# Patient Record
Sex: Female | Born: 1960 | Race: Black or African American | Hispanic: No | Marital: Married | Smoking: Never smoker
Health system: Southern US, Community
[De-identification: ages and names within clinical notes are randomized; demographics above are authoritative.]

## PROBLEM LIST (undated history)

## (undated) DIAGNOSIS — R42 Dizziness and giddiness: Secondary | ICD-10-CM

## (undated) DIAGNOSIS — G478 Other sleep disorders: Secondary | ICD-10-CM

## (undated) DIAGNOSIS — N6452 Nipple discharge: Secondary | ICD-10-CM

## (undated) DIAGNOSIS — E7800 Pure hypercholesterolemia, unspecified: Secondary | ICD-10-CM

## (undated) DIAGNOSIS — R112 Nausea with vomiting, unspecified: Secondary | ICD-10-CM

## (undated) DIAGNOSIS — Z9889 Other specified postprocedural states: Secondary | ICD-10-CM

## (undated) DIAGNOSIS — K589 Irritable bowel syndrome without diarrhea: Secondary | ICD-10-CM

## (undated) DIAGNOSIS — K219 Gastro-esophageal reflux disease without esophagitis: Secondary | ICD-10-CM

## (undated) HISTORY — PX: TUBAL LIGATION: SHX77

## (undated) HISTORY — PX: CHOLECYSTECTOMY: SHX55

## (undated) HISTORY — PX: ESOPHAGOGASTRODUODENOSCOPY (EGD) WITH ESOPHAGEAL DILATION: SHX5812

## (undated) HISTORY — PX: BREAST EXCISIONAL BIOPSY: SUR124

## (undated) HISTORY — PX: ABDOMINAL HYSTERECTOMY: SHX81

---

## 2001-09-13 ENCOUNTER — Ambulatory Visit (HOSPITAL_COMMUNITY): Admission: RE | Admit: 2001-09-13 | Discharge: 2001-09-13 | Payer: Self-pay | Admitting: Internal Medicine

## 2001-09-16 ENCOUNTER — Encounter: Payer: Self-pay | Admitting: Internal Medicine

## 2001-09-16 ENCOUNTER — Ambulatory Visit (HOSPITAL_COMMUNITY): Admission: RE | Admit: 2001-09-16 | Discharge: 2001-09-16 | Payer: Self-pay | Admitting: Internal Medicine

## 2002-03-06 ENCOUNTER — Ambulatory Visit (HOSPITAL_COMMUNITY): Admission: RE | Admit: 2002-03-06 | Discharge: 2002-03-06 | Payer: Self-pay | Admitting: Internal Medicine

## 2002-03-06 ENCOUNTER — Encounter: Payer: Self-pay | Admitting: Internal Medicine

## 2002-04-26 ENCOUNTER — Encounter: Payer: Self-pay | Admitting: Specialist

## 2002-04-26 ENCOUNTER — Ambulatory Visit (HOSPITAL_COMMUNITY): Admission: RE | Admit: 2002-04-26 | Discharge: 2002-04-26 | Payer: Self-pay | Admitting: Specialist

## 2002-06-29 HISTORY — PX: CARPAL TUNNEL RELEASE: SHX101

## 2002-07-18 ENCOUNTER — Ambulatory Visit (HOSPITAL_COMMUNITY): Admission: RE | Admit: 2002-07-18 | Discharge: 2002-07-18 | Payer: Self-pay | Admitting: Internal Medicine

## 2003-04-04 ENCOUNTER — Ambulatory Visit (HOSPITAL_COMMUNITY): Admission: RE | Admit: 2003-04-04 | Discharge: 2003-04-04 | Payer: Self-pay | Admitting: Internal Medicine

## 2003-04-11 ENCOUNTER — Encounter: Payer: Self-pay | Admitting: Internal Medicine

## 2003-04-11 ENCOUNTER — Ambulatory Visit (HOSPITAL_COMMUNITY): Admission: RE | Admit: 2003-04-11 | Discharge: 2003-04-11 | Payer: Self-pay | Admitting: Internal Medicine

## 2003-05-10 ENCOUNTER — Ambulatory Visit (HOSPITAL_COMMUNITY): Admission: RE | Admit: 2003-05-10 | Discharge: 2003-05-10 | Payer: Self-pay | Admitting: Specialist

## 2003-06-05 ENCOUNTER — Ambulatory Visit (HOSPITAL_COMMUNITY): Admission: RE | Admit: 2003-06-05 | Discharge: 2003-06-05 | Payer: Self-pay | Admitting: Internal Medicine

## 2004-05-12 ENCOUNTER — Ambulatory Visit (HOSPITAL_COMMUNITY): Admission: RE | Admit: 2004-05-12 | Discharge: 2004-05-12 | Payer: Self-pay | Admitting: Specialist

## 2004-05-19 ENCOUNTER — Ambulatory Visit: Payer: Self-pay | Admitting: Internal Medicine

## 2004-05-30 ENCOUNTER — Ambulatory Visit: Payer: Self-pay | Admitting: Internal Medicine

## 2004-07-02 ENCOUNTER — Ambulatory Visit: Payer: Self-pay | Admitting: Internal Medicine

## 2004-08-14 ENCOUNTER — Ambulatory Visit (HOSPITAL_COMMUNITY): Admission: RE | Admit: 2004-08-14 | Discharge: 2004-08-14 | Payer: Self-pay | Admitting: Family Medicine

## 2005-01-01 ENCOUNTER — Ambulatory Visit: Payer: Self-pay | Admitting: Internal Medicine

## 2005-04-28 ENCOUNTER — Ambulatory Visit: Payer: Self-pay | Admitting: Gastroenterology

## 2005-05-01 ENCOUNTER — Ambulatory Visit: Payer: Self-pay | Admitting: Gastroenterology

## 2005-05-18 ENCOUNTER — Ambulatory Visit (HOSPITAL_COMMUNITY): Admission: RE | Admit: 2005-05-18 | Discharge: 2005-05-18 | Payer: Self-pay | Admitting: Specialist

## 2006-05-26 ENCOUNTER — Ambulatory Visit (HOSPITAL_COMMUNITY): Admission: RE | Admit: 2006-05-26 | Discharge: 2006-05-26 | Payer: Self-pay | Admitting: Obstetrics and Gynecology

## 2006-09-11 ENCOUNTER — Emergency Department (HOSPITAL_COMMUNITY): Admission: EM | Admit: 2006-09-11 | Discharge: 2006-09-11 | Payer: Self-pay | Admitting: Emergency Medicine

## 2007-05-31 ENCOUNTER — Ambulatory Visit (HOSPITAL_COMMUNITY): Admission: RE | Admit: 2007-05-31 | Discharge: 2007-05-31 | Payer: Self-pay | Admitting: Obstetrics and Gynecology

## 2008-06-15 ENCOUNTER — Ambulatory Visit (HOSPITAL_COMMUNITY): Admission: RE | Admit: 2008-06-15 | Discharge: 2008-06-15 | Payer: Self-pay | Admitting: Obstetrics and Gynecology

## 2008-10-17 ENCOUNTER — Ambulatory Visit: Payer: Self-pay | Admitting: Orthopedic Surgery

## 2008-10-17 DIAGNOSIS — M2240 Chondromalacia patellae, unspecified knee: Secondary | ICD-10-CM

## 2008-10-17 DIAGNOSIS — M25569 Pain in unspecified knee: Secondary | ICD-10-CM | POA: Insufficient documentation

## 2008-10-17 DIAGNOSIS — IMO0002 Reserved for concepts with insufficient information to code with codable children: Secondary | ICD-10-CM

## 2008-10-18 ENCOUNTER — Encounter: Payer: Self-pay | Admitting: Orthopedic Surgery

## 2009-05-28 ENCOUNTER — Encounter: Payer: Self-pay | Admitting: Obstetrics and Gynecology

## 2009-05-28 ENCOUNTER — Encounter: Admission: RE | Admit: 2009-05-28 | Discharge: 2009-05-28 | Payer: Self-pay | Admitting: Obstetrics and Gynecology

## 2009-10-29 ENCOUNTER — Encounter: Payer: Self-pay | Admitting: Orthopedic Surgery

## 2009-11-05 ENCOUNTER — Ambulatory Visit: Payer: Self-pay | Admitting: Orthopedic Surgery

## 2009-11-05 DIAGNOSIS — S92919A Unspecified fracture of unspecified toe(s), initial encounter for closed fracture: Secondary | ICD-10-CM | POA: Insufficient documentation

## 2009-12-02 ENCOUNTER — Telehealth: Payer: Self-pay | Admitting: Orthopedic Surgery

## 2010-04-07 ENCOUNTER — Ambulatory Visit (HOSPITAL_COMMUNITY): Admission: RE | Admit: 2010-04-07 | Discharge: 2010-04-07 | Payer: Self-pay | Admitting: Family Medicine

## 2010-05-29 ENCOUNTER — Telehealth: Payer: Self-pay | Admitting: Orthopedic Surgery

## 2010-05-30 ENCOUNTER — Encounter: Payer: Self-pay | Admitting: Orthopedic Surgery

## 2010-06-17 ENCOUNTER — Ambulatory Visit: Payer: Self-pay | Admitting: Orthopedic Surgery

## 2010-07-20 ENCOUNTER — Encounter: Payer: Self-pay | Admitting: Obstetrics and Gynecology

## 2010-07-29 NOTE — Progress Notes (Signed)
Summary: toe swelling   Phone Note Call from Patient   Caller: Patient Summary of Call: Patient notes swelling around toes after taking tape off; states can only wear open toe or flip flop, no regular shoe yet.  States has been icing. Fol/up was as needed. Any further recommendations or schedule to come back in? Initial call taken by: Cammie Sickle,  December 02, 2009 3:24 PM  Follow-up for Phone Call        this is normal fu if increased pain redness, continue ice and elevation Follow-up by: Ether Griffins,  December 02, 2009 3:48 PM  Additional Follow-up for Phone Call Additional follow up Details #1::        Advised patient. Additional Follow-up by: Cammie Sickle,  December 02, 2009 5:55 PM

## 2010-07-29 NOTE — Progress Notes (Signed)
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  Also said knee hurts a lot when walking outside. Please advise. Patient has also scheduled appt for office 06/17/10 + on wait list. Initial call taken by: Cammie Sickle,  May 29, 2010 4:21 PM  Follow-up for Phone Call        send to [facility] with script for open ROM hinge knee brace  Follow-up by: Fuller Canada MD,  May 30, 2010 7:39 AM  Additional Follow-up for Phone Call Additional follow up Details #1::        ok, pt to come in and pic up rx for brace befoer 12 today Additional Follow-up by: Ether Griffins,  May 30, 2010 8:46 AM

## 2010-07-29 NOTE — Letter (Signed)
Summary: History form  History form   Imported By: Jacklynn Ganong 11/07/2009 16:08:53  _____________________________________________________________________  External Attachment:    Type:   Image     Comment:   External Document

## 2010-07-29 NOTE — Assessment & Plan Note (Signed)
Summary: LT FOOT INJURY/?FX TOE/NEEDS XRAY/PCHA,MEDCOST/CAF   Vital Signs:  Patient profile:   50 year old female Height:      65 inches Weight:      263 pounds Pulse rate:   78 / minute Resp:     16 per minute  Vitals Entered By: Fuller Canada MD (Nov 05, 2009 2:15 PM)  Visit Type:  new problem Referring Provider:  self Primary Provider:  Dr. Nobie Putnam  CC:  left pinky toe pain.  History of Present Illness: I saw Christie Manning in the office today for an initial visit.  She is a 50 years old woman with the complaint of:  left pinky toe pain.  DOI 10/27/09.  Xrays today in our office.  Meds: Estroven, Zocor, Zegrid, Advil.  She complains of mild-to-moderate pain over the small toe of the LEFT foot with some swelling and pain radiating into the midfoot.    Allergies: 1)  ! Aspirin  Past History:  Past Medical History: reflux cholesterol htn  Past Surgical History: carpal tunnel releases bilateral hands rt foot hysterectomy gallbladder  Social History: Patient is married.  full time student no smoking no alcohol use 2 cups per day of caffeine  Review of Systems Constitutional:  Complains of fatigue; denies weight loss, weight gain, fever, and chills. Cardiovascular:  Complains of palpitations; denies chest pain, fainting, and murmurs. Respiratory:  Denies short of breath, wheezing, couch, tightness, pain on inspiration, and snoring . Gastrointestinal:  Complains of nausea and constipation; denies heartburn, vomiting, diarrhea, and blood in your stools. Genitourinary:  Denies frequency, urgency, difficulty urinating, painful urination, flank pain, and bleeding in urine. Neurologic:  Denies numbness, tingling, unsteady gait, dizziness, tremors, and seizure. Musculoskeletal:  Complains of joint pain; denies swelling, instability, stiffness, redness, heat, and muscle pain. Endocrine:  Denies excessive thirst, exessive urination, and heat or cold  intolerance. Psychiatric:  Denies nervousness, depression, anxiety, and hallucinations. Skin:  Complains of itching; denies changes in the skin, poor healing, rash, and redness. HEENT:  Denies blurred or double vision, eye pain, redness, and watering. Immunology:  Complains of seasonal allergies; denies sinus problems and allergic to bee stings. Hemoatologic:  Denies easy bleeding and brusing.  Physical Exam  Additional Exam:  Normal Appearance, Oriented x 3, Mood normal LEFT foot no swelling or tenderness in the proximal phalanx.  Range of motion is somewhat discomforting.  Toe is stable.  Muscle tone normal.  Skin intact.     Impression & Recommendations:  Problem # 1:  CLOSED FRACTURE OF ONE OR MORE PHALANGES OF FOOT (ICD-826.0) Assessment New  3 views LEFT foot there is a fracture oblique proximal phalanx LEFT small toe no displacement or angulation  Orders: New Patient Level II (70350) Toe Fx (09381) Toe(s) x-ray, minimum 2 views (82993)  Patient Instructions: 1)  Tape together for 3 weeks  2)  Please schedule a follow-up appointment as needed.

## 2010-07-29 NOTE — Medication Information (Signed)
Summary: Tax adviser   Imported By: Cammie Sickle 05/30/2010 10:55:50  _____________________________________________________________________  External Attachment:    Type:   Image     Comment:   External Document

## 2010-09-26 ENCOUNTER — Ambulatory Visit (HOSPITAL_COMMUNITY)
Admission: RE | Admit: 2010-09-26 | Discharge: 2010-09-26 | Disposition: A | Discharge status: Home / Self Care | Payer: PRIVATE HEALTH INSURANCE | Source: Ambulatory Visit | Attending: Family Medicine | Admitting: Family Medicine

## 2010-09-26 ENCOUNTER — Encounter (HOSPITAL_COMMUNITY): Payer: Self-pay

## 2010-09-26 ENCOUNTER — Other Ambulatory Visit (HOSPITAL_COMMUNITY): Payer: Self-pay | Admitting: Family Medicine

## 2010-09-26 DIAGNOSIS — R0789 Other chest pain: Secondary | ICD-10-CM | POA: Insufficient documentation

## 2010-11-14 NOTE — Op Note (Signed)
NAME:  Christie Manning, Christie Manning                    ACCOUNT NO.:  [id number]   MEDICAL RECORD NO.:  [id number]                   PATIENT TYPE:  AMB   LOCATION:  DAY                                  FACILITY:  [facility]   PHYSICIAN:  R. Roetta Sessions, M.D.              DATE OF BIRTH:  03/09/1961   DATE OF PROCEDURE:  07/19/2002  DATE OF DISCHARGE:  07/18/2002                                 OPERATIVE REPORT   PROCEDURE:  Ambulatory esophageal pH report.   REFERRING PHYSICIAN:  Dr. Rudell Cobb with [facility].   INDICATIONS:  Epigastric pain, gastroesophageal reflux disease, and nausea  with no response to PPI therapy.   PRIOR STUDIES:  Please see esophageal manometry study.   METHOD:  The pH electrodes were placed 20 and 5 cm above the proximal border  of the manometrically determined lower esophageal sphincter (LES). These  electrodes were defined as channels 1 and 2, respectively. The data was  converted using the [employer], version 2.30. A reflux  episode was defined as a drop in pH below 4.0. The procedure was reviewed  with the patient prior to performing it.   FINDINGS:  Proximal border of the LES (location from nares):  40 cm  Study duration:  Approximately 14 hours.  The patient pulled pH probe out on  her own around 10:40 p.m.  Channel 2 analysis:  Total time pH below 4.0 (min):  56  Fraction of total time pH below 4.0:  3.9%  Number of reflux episodes:  38  Number of reflux episodes longer than 5 minutes: 2  Longest reflux episode:  10 minutes.  DeMeester score (DeMeester normals <14.7 95th percentile):  Was not  calculated due to incomplete study.   IMPRESSION:  This pH probe study was performed off  of proton pump inhibitor  therapy.  The patient discontinued medication 2 weeks prior to study. This  study was incomplete, we have readings for only 14 hours.  Given this;  however, she did have evidence of acid reflux, the longest  episode recorded  was 10 minutes and only 2 episodes longer than 5 minutes.  It is notable,  however, that the patient's symptoms correlated with episodes of pH  __________  4 with only 1 of the 4 (25%) of symptoms recorded.  Considering  this study is incomplete, it is difficult to determine the degree of  patient's acid reflux.   PLAN:  1. She has a follow up appointment with Dr. Rudell Cobb at [facility] to discuss these results.  The pH study was requested by     Dr. Rudell Cobb.  2. Unfortunately she has been unable to tolerate all 5 proton pump     inhibitors due to either side effects or no     response with therapy.  For now she will use OTC antacids, i.e. TUMS     p.r.n.  3.  Recommended repeat pH probe study in order to get complete analysis;     however, the patient declines.   This study was reviewed by Dr. Jena Gauss.     Tana Coast, Pricilla Larsson, M.D.    LL/MEDQ  D:  07/25/2002  T:  07/25/2002  Job:  [id number]   cc:   Dr. Rudell Cobb  [facility]

## 2010-11-14 NOTE — Op Note (Signed)
NAME:  Christie Manning, Christie Manning                    ACCOUNT NO.:  [id number]   MEDICAL RECORD NO.:  [id number]                   PATIENT TYPE:  AMB   LOCATION:  DAY                                  FACILITY:  [facility]   PHYSICIAN:  R. Roetta Sessions, M.D.              DATE OF BIRTH:  1961-02-04   DATE OF PROCEDURE:  04/04/2003  DATE OF DISCHARGE:                                 OPERATIVE REPORT   PROCEDURE:  Esophagogastroduodenoscopy with Elease Hashimoto dilation followed by  biopsy.   ENDOSCOPIST:  Gerrit Friends. Rourk, M.D.   INDICATIONS FOR PROCEDURE:  The patient is a 50 year old lady with recurrent  esophageal dysphagia, history of Schatzki ring having undergone dilatation  previously.  EGD is now being done to further evaluate her symptoms.  This  approach has been discussed with the patient. The potential risks, benefits,  and alternatives have been reviewed; questions answered.  Please see my  March 29, 2003 H&P for more information.   PROCEDURE NOTE:  O2 saturation, blood pressure, pulse and respirations were  monitored throughout the entire procedure.  Conscious sedation: Versed 4 mg  IV, Demerol 50 mg IV in divided doses, Cetacaine spray for topical  oropharyngeal anesthesia.   INSTRUMENT:  Olympus video chip adult gastroscope.   FINDINGS:  Examination of the tubular esophagus revealed an indistinct  Schatzki ring.  The esophageal mucosa otherwise appeared normal.  The EG  junction was easily traversed.   STOMACH:  The gastric cavity was empty.  It insufflated well with air.  A  thorough examination of the gastric mucosa including a retroflex view of the  proximal stomach and esophagogastric junction demonstrated a small hiatal  hernia and multiple 3-to-5-mm fundal polyps.  The pylorus was patent and  easily traversed.   DUODENUM:  The bulb and the second portion appeared normal.   THERAPEUTIC/DIAGNOSTIC MANEUVERS:  A 56 French Maloney dilator was passed  with ease. A look back  revealed no apparent complication related to the  passage of the dilator.  Subsequently 2 of the polyps were cold biopsied.   The patient tolerated the procedure well and was reacted in endoscopy.   IMPRESSION:  1. Indistinct noncritical appearing Schatzki ring status post dilatation as     described above; otherwise normal esophagus.  2. Small hiatal hernia.  3. Multiple fundal polyps, of doubtful clinical significance, biopsied.  4. Otherwise normal gastric mucosa, normal D1 and D2.    RECOMMENDATIONS:  1. Continue Nexium 40 mg orally b.i.d.  2. Follow up on path.  3. Follow up appointment with [place] in 3 months.      ___________________________________________                                            Jonathon Bellows, M.D.   RMR/MEDQ  D:  04/04/2003  T:  04/04/2003  Job:  [id number]

## 2010-11-14 NOTE — Op Note (Signed)
NAME:  Christie Manning, Christie Manning                    ACCOUNT NO.:  [id number]   MEDICAL RECORD NO.:  [id number]                   PATIENT TYPE:  AMB   LOCATION:  DAY                                  FACILITY:  [facility]   PHYSICIAN:  R. Roetta Sessions, M.D.              DATE OF BIRTH:  Oct 04, 1960   DATE OF PROCEDURE:  07/18/2002  DATE OF DISCHARGE:  07/18/2002                                 OPERATIVE REPORT   PROCEDURE:  Esophageal manometry.   REFERRING PHYSICIAN:  Dr. Rudell Cobb with [facility].   INDICATIONS:  Prior to ambulatory 24-hour pH probe study, epigastric pain,  gastroesophageal reflux disease, and nausea.   PRIOR STUDIES:  EGD in March 2003 by Dr. Jena Gauss revealed indistinct Schatzki  ring, status post dilatation, small hiatal hernia, benign fundal polyps.  Barium pill esophagram shortly afterwards showed small hiatal hernia, mild  transverse narrowing of the cervical esophagus, 12.5-mm tablet passed  without evidence of obstruction.  Upper GI series with small-bowel follow  through revealed 5-to-6-cm sliding hiatal hernia, but otherwise normal.   METHOD:  The esophageal manometry study was performed using [employer]  Medical Gastrosoft Upper Gastrointestinal Polygram, version 6.40, using an  Arndorfer infusion system at 0.5 cc/min. The lower esophageal sphincter  (LES) was identified in four ports with the standard station pull through  technique. For esophageal body pressures, the tip of the catheter was placed  3 cm above the proximal aspect of the LES. Ten wet swallows were taken with  ports 5 cm apart in the esophageal body. This procedure was reviewed with  the patient prior to performing it.   FINDINGS:  Esophageal body amplitude (mmHg):  87.4 at 13 cm (normal 38-102),  29.9 at 8 cm (normal 49-131), 227 at 3 cm (normal 64-154), 43.5 mean (distal  two ports) (normal 59-139).   Duration (seconds):  4.4 at 13 cm (normal 3-5), 4.6 at 8 cm (normal 3-5),  5.2 at 3 cm (normal 2.5-3.5), 4.9 mean (distal two ports) (normal 3.5).   Contractions (with wet swallows):  40% peristaltic, 3% simultaneous and 3%  low amplitude.   Lower esophageal sphincter (LES):   Located at 44 cm (from nares).  Resting pressure:  14.5 mmHg.  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  She also did have some swallows with simultaneous contractions.  There was no evidence of achalasia, however.  Esophageal manometry study  done primarily for placement of pH probe.   RECOMMENDATION:  Please see 24-hour pH probe study report.  Esophageal  manometry was reviewed by Dr. Jena Gauss as well.     Tana Coast, P.AJonathon Bellows, M.D.    LL/MEDQ  D:  07/25/2002  T:  07/25/2002  Job:  [id number]   cc:   Dr. Rudell Cobb  [facility]

## 2010-11-14 NOTE — Op Note (Signed)
[facility]  Patient:    Christie Manning, Christie Manning Visit Number: [id number] MRN: [id number]          Service Type: END Location: DAY Attending Physician:  Jonathon Bellows Dictated by:   Roetta Sessions, M.D. Proc. Date: 09/13/01 Admit Date:  09/13/2001   CC:         Patrica Duel, M.D.   Operative Report  PROCEDURE:  Esophagogastroduodenoscopy with Elease Hashimoto dilation followed by biopsy.  INDICATIONS FOR PROCEDURE:  The patient is a 50 year old lady with recurrent esophageal dysphagia.  An EGD in 1996 revealed no abnormalities.  She responded nicely to passage of a 54-French Maloney dilator.  Reflux symptoms are well controlled on Prevacid 30 mg orally daily.  She did have some gastric polyps in 1996.  CLO-testing and H pylori serologies were negative.  EGD is now being done to further evaluate her symptoms.  This approach has been discussed with the patient previously.  Potential risks, benefits and alternatives have been reviewed and questions answered and she is agreeable. Please see my September 12, 2001 consultation note.  DESCRIPTION OF PROCEDURE:  Oxygen saturation, blood pressure, pulse, respirations were monitored throughout the entire procedure.  CONSCIOUS SEDATION:  Conscious sedation:  Versed 4 mg in divided doses, Demerol 50 mg IV.  Cetacaine spray was used for topical oropharyngeal anesthesia.  INSTRUMENT:  Olympus diagnostic gastroscope.  FINDINGS:  Examination of tubular esophagus revealed an indistinct Schatzkis ring.  No other abnormalities were observed.  The EG junction was easily traversed.  STOMACH:  The gastric cavity insufflated well with air.  A thorough examination of the gastric mucosa including retroflexed view of the proximal stomach, esophagogastric junction demonstrated a small hiatal hernia and multiple 5 mm fundal polyps.  Pylorus was patent and easily traversed.  DUODENUM:  Bulb and second portion appeared  normal.  THERAPEUTIC/DIAGNOSTIC MANEUVERS PERFORMED:  A 54-French Maloney dilator was passed to full insertion with ease.  A look back revealed a slight amount of blood at the EG junction.  The ring appeared to have been ruptured. Subsequently one of the polyps was biopsied for histologic study.  The patient tolerated the procedure well and was reacted to endoscopy.  IMPRESSION: 1. Indistinct Schatzkis ring, status post dilation as described above. 2. Small hiatal hernia. 3. Fundal polyps. 4. The remainder of the gastric mucosa and duodenum and second portion    appeared normal.  RECOMMENDATIONS: 1. Resume Prevacid 30 mg orally daily. 2. Will double check H pylori status with repeat H pylori serologies. 3. Follow up on pathology. 4. Further recommendations to follow. Dictated by:   Roetta Sessions, M.D. Attending Physician:  Jonathon Bellows DD:  09/13/01 TD:  09/14/01 Job: [id number]

## 2010-11-14 NOTE — Consult Note (Signed)
[facility]  Patient:    Christie Manning, Christie Manning Visit Number: [id number] MRN: [id number]          Service Type: OUT Location: RAD Attending Physician:  Jonathon Bellows Dictated by:   Roetta Sessions, M.D. Proc. Date: 09/12/01 Admit Date:  09/16/2001 Discharge Date: 09/16/2001   CC:         Patrica Duel, M.D.   Consultation Report  REASON FOR CONSULTATION:  Recurrent esophageal dysphagia.  HISTORY OF PRESENT ILLNESS:  The patient is a pleasant 50 year old African-American female referred courtesy of Dr. Nobie Putnam to further evaluate her recurrent esophageal dysphagia of solids and some liquids.  I last saw this nice lady back in February 1997.  In 1996, she underwent an EGD for esophageal dysphagia.  She had a normal tubular esophagus.  Mucosa appeared normal.  A 54 French balloon dilator was passed empirically.  This was associated with essentially resolution of her dysphagia until the past six months or so.  She did have a polypoid lesion of the stomach.  Biopsies revealed benign gastric mucosa.  CLOtest and H. pylori serologies were negative.  She responded nicely to a course of Prevacid and Propulsid for probable nonulcerative dyspepsia.  Back in 1997, her amylase, lipase, CBC, and liver profile were all entirely normal.  She was treated for E. coli and Proteus UTI by me with Cipro back at that time.  She now describes reflux-type symptoms which are better on Prevacid 30 mg orally daily which was started back on July 22, 2001.  She was having some hoarseness in the past couple of weeks for which she was started on Carafate.  Bowels have been doing okay although she has had some constipation with Carafate.  She has not had any diarrhea, melena, or rectal bleeding.  She does not have any anterior pulmonary symptoms.  It is notable that from July 08, 1995, her weight was 192.  Now, she weighs 228 pounds.  She is status post  laparoscopic cholecystectomy by Dr. Kate Sable in either 1999 or 2000, status post bilateral carpal tunnel surgery by Dr. Darreld Mclean last year.  There is no family history for inflammatory bowel disease or GI neoplasia.  PAST MEDICAL HISTORY:  Significant for esophageal dysphagia, history of GERD, history of nonulcerative dyspepsia.  PAST SURGICAL HISTORY:  Bilateral carpal tunnel syndrome, tubal ligation, cholecystectomy.  CURRENT MEDICATIONS:  Prevacid 30 mg orally daily.  ALLERGIES:  ASA causes nausea and vomiting.  FAMILY HISTORY:  Negative for chronic GI or liver disease.  SOCIAL HISTORY:  Patient has two children.  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  She is also in classes at [employer] in pre-nursing.  REVIEW OF SYSTEMS:  No palpitations, no dyspnea on exertion, no fever, chills. Weight gain as outlined above.  PHYSICAL EXAMINATION:  GENERAL:  A pleasant 50 year old lady resting comfortably.  VITAL SIGNS:  Weight 228, blood pressure 110/74, pulses 66.  Skin warm and dry.  HEENT:  No scleral icterus.  Conjunctivae are pink.  JVD is not prominent. CHEST:  Lungs are clear to auscultation.  CARDIAC:  Regular rate and rhythm without murmur, gallop, or rub.  BREASTS:  Exam deferred.  ABDOMEN:  Obese, positive bowel sounds, soft, nontender, without appreciable hepatosplenomegaly.  ASSESSMENT:  The patient is a pleasant 50 year old lady with recurrent esophageal dysphagia although no lesion was found in her esophagus.  She previously responded nicely to the passage of a Maloney dilator.  In this  setting, I have offered the patient a repeat esophagogastroduodenoscopy with [facility] dilation.  I have discussed this procedure with her along with the potential risks, benefits, and alternatives.  Her questions were answered.  I feel she is at low risk for conscious sedation.  Will plan to perform esophagogastroduodenoscopy with possible  esophageal dilation in the near future at [facility].  I would like to thank Dr. Patrica Duel for allowing me to see this nice lady once again. Dictated by:   Roetta Sessions, M.D. Attending Physician:  Jonathon Bellows DD:  09/12/01 TD:  09/12/01 Job: [id number]

## 2010-11-14 NOTE — H&P (Signed)
Christie Manning, Christie Manning                    ACCOUNT NO.:  [id number]   MEDICAL RECORD NO.:  [id number]                  PATIENT TYPE:   LOCATION:                                       FACILITY:   PHYSICIAN:  R. Roetta Sessions, M.D.              DATE OF BIRTH:  11/28/1960   DATE OF ADMISSION:  03/29/2003  DATE OF DISCHARGE:                                HISTORY & PHYSICAL   CHIEF COMPLAINT:  1. Recurrent esophageal dysphagia.  2. Gastroesophageal reflux disease symptoms.  3. Hoarseness.   HISTORY OF PRESENT ILLNESS:  Christie Manning is a 50 year old, nearly morbid  obese African American female, followed primarily by Christie Manning who  was last seen here for abdominal pain, bloating and reflux symptoms on  April 06, 2002.  Since she was last seen here, constipation has been  combated effectively with Zelnorm 60 mg orally b.i.d. to once daily.  She  has been on a variety of proton pump inhibitors therapy previously including  Nexium, Prevacid more recently.  Aciphex once daily.  She is now having  breakthrough symptoms and reports recurrent esophageal dysphagia back in  1999.  She underwent EGD and was found to have a Schatzki's ring which was  dilated with a Maloney dilator.  This was associated with fairly good long  term improvement in symptoms.  She has gained a great deal of weight since  1997 (approximately 50 pounds).  She wanted gastric bypass surgery.  She was  evaluated by Christie Manning and Christie Manning as well as Christie Manning at  [facility].  She was found to have an element of GERD and abdominal  wall pain and she as not felt to be a good candidate for antireflux surgery.  Because of her excessive weight, she was advised to loose weight.  Unfortunately, she has gained 7 more pounds, and she was seen here in  October 2003.  Has not had any melena or rectal bleeding.  Bowel function is  pretty much 1-2 daily on Zelnorm 60 mg predominantly once daily.   PAST  MEDICAL HISTORY:  1. Esophageal dysphagia.  2. GERD.  3. Schatzki's ring.  4. Nonulcerative dyspepsia.   PAST SURGICAL HISTORY:  1. Cholecystectomy.  2. Tubal ligation.  3. Bilateral carpal tunnel syndrome surgery.  4. Workup for GERD.  5. Previously EGD.  6. Esophageal manometry and amateur pH study.  The latter study incomplete.     She pulled a tube out.  Did not show overt reflux disease and was     relatively poor correlation with symptoms in episodes of reflux.   MEDICATIONS:  1. Vivelle patch 0.1 mg every two weeks.  2. Aciphex 20 mg orally once daily times p.r.n.  3. Zelnorm 60 mg once daily.  4. Clarinex 5 mg daily.   ALLERGIES:  ASPIRIN.   FAMILY HISTORY:  Negative for chronic GI or liver disease.   SOCIAL HISTORY:  The  patient has two children.  She works for [employer].  She has been taking pre-nursing classes.   REVIEW OF SYSTEMS:  As in history of present illness.   PHYSICAL EXAMINATION:  GENERAL APPEARANCE:  This was a pleasant 50 year old  lady resting comfortably.  VITAL SIGNS:  Weight 238, blood pressure 110/80, pulse 76.  HEENT:  No scleral icterus.  CHEST:  Lungs are clear to auscultation.  CARDIAC:  Regular rate and rhythm without murmurs, rubs or gallops.  ABDOMEN:  Nondistended, obese, positive bowel sounds, soft.  No obvious mass  or organomegaly.  EXTREMITIES:  No edema.   ASSESSMENT:  Christie Manning is a pleasant 50 year old morbidly obese lady with  gastroesophageal reflux disease symptoms and esophageal dysphagia.  Gastroesophageal reflux disease symptoms have been difficult to control.  Prior endoscopic evaluation and ambulatory esophageal pH monitoring have  failed to demonstrate significant gastroesophageal reflux.  I suspect we are  dealing more with NERD (nonerosive gastroesophageal reflux disease) which  almost always has a functional component and is more difficult to control  than the typical gastroesophageal reflux disease.   Nexium worked well for  her previously.   RECOMMENDATIONS:  I have offered this lady repeat EGD with esophageal  dilation, as she does have a history of a Schatzki's ring and derived  significant improvement after dilation previously.  Potential risks,  benefits and alternatives have been reviewed.  Will go ahead and get her  back on an aggressive regimen to be Nexium 40 mg orally b.i.d.  Samples  given today.  Will make further recommendations after endoscopic evaluation  has been completed.     ___________________________________________                                         Christie Manning, M.D.   RMR/MEDQ  D:  03/29/2003  T:  03/29/2003  Job:  [id number]   cc:   R. Roetta Sessions, M.D.  [id number]  [place]  [place] [address]  Fax: [phone]

## 2011-04-23 ENCOUNTER — Encounter: Payer: Self-pay | Admitting: Orthopedic Surgery

## 2011-04-23 ENCOUNTER — Ambulatory Visit (INDEPENDENT_AMBULATORY_CARE_PROVIDER_SITE_OTHER): Payer: PRIVATE HEALTH INSURANCE | Admitting: Orthopedic Surgery

## 2011-04-23 VITALS — BP 106/70 | Ht 64.0 in | Wt 257.4 lb

## 2011-04-23 DIAGNOSIS — M5430 Sciatica, unspecified side: Secondary | ICD-10-CM

## 2011-04-23 MED ORDER — GABAPENTIN 100 MG PO CAPS: 100.0000 mg | ORAL_CAPSULE | Freq: Every day | ORAL | Status: DC

## 2011-04-23 NOTE — Progress Notes (Signed)
Pain LEFT knee and LEFT leg.  50 year old female new problem. The patient was lifting her mom at the nursing home felt pain in her LEFT hip rating down her LEFT leg approximately 2 weeks ago. She's been taking ibuprofen and Robaxin. The pain seemed to be getting better until yesterday and started hurting again. She is now having stabbing 8/10. Constant pain with numbness and tingling in the LEFT foot, which is worse when she stands up and when she puts pressure or lies down on her LEFT side.  Review of systems heartburn, constipation, tingling, as stated. Joint pain and swelling. Denies genitourinary symptoms or difficulty voiding.  She gives [place] a medical history of previous RIGHT and LEFT carpal tunnel releases. No major medical problems.  She would ports allergic to aspirin.  Family history of arthritis, cancer, and diabetes.  She is married unemployed does not drink or smoke.  Physical Exam(12) GENERAL: normal development grooming and hygiene, obesity  CDV: pulses are normal   Skin: normal  Lymph: nodes were not palpable/normal  Psychiatric: awake, alert and oriented  Neuro: normal sensation  MSK gait normal  1She is tender in her lower back, LEFT gluteal area. She has no pain with flexion. She can reproduce her pain with extension.  She has normal heel walking and toe walking. Neurovascular exam is normal.   Assessment: Back pain with sciatica    Plan: Recommended Gabapentin . She's had reaction to the steroids in the past with palpitations over going to avoid that at this time. There

## 2011-04-23 NOTE — Patient Instructions (Signed)
4 weeks recheck   start gabapentin 100 mg 3 x a day (its at the pharmacy)

## 2011-05-07 ENCOUNTER — Other Ambulatory Visit (HOSPITAL_COMMUNITY): Payer: Self-pay | Admitting: Obstetrics and Gynecology

## 2011-05-07 DIAGNOSIS — Z139 Encounter for screening, unspecified: Secondary | ICD-10-CM

## 2011-05-11 ENCOUNTER — Ambulatory Visit (HOSPITAL_COMMUNITY): Payer: PRIVATE HEALTH INSURANCE

## 2011-05-18 ENCOUNTER — Ambulatory Visit (HOSPITAL_COMMUNITY)
Admission: RE | Admit: 2011-05-18 | Discharge: 2011-05-18 | Disposition: A | Discharge status: Home / Self Care | Payer: PRIVATE HEALTH INSURANCE | Source: Ambulatory Visit | Attending: Obstetrics and Gynecology | Admitting: Obstetrics and Gynecology

## 2011-05-18 DIAGNOSIS — Z1231 Encounter for screening mammogram for malignant neoplasm of breast: Secondary | ICD-10-CM | POA: Insufficient documentation

## 2011-05-18 DIAGNOSIS — Z139 Encounter for screening, unspecified: Secondary | ICD-10-CM

## 2011-05-26 ENCOUNTER — Ambulatory Visit: Payer: PRIVATE HEALTH INSURANCE | Admitting: Orthopedic Surgery

## 2011-06-11 ENCOUNTER — Ambulatory Visit: Payer: PRIVATE HEALTH INSURANCE | Admitting: Orthopedic Surgery

## 2011-07-07 ENCOUNTER — Ambulatory Visit: Payer: PRIVATE HEALTH INSURANCE | Admitting: Orthopedic Surgery

## 2011-07-09 ENCOUNTER — Encounter: Payer: Self-pay | Admitting: Orthopedic Surgery

## 2011-07-09 ENCOUNTER — Ambulatory Visit (INDEPENDENT_AMBULATORY_CARE_PROVIDER_SITE_OTHER): Payer: PRIVATE HEALTH INSURANCE | Admitting: Orthopedic Surgery

## 2011-07-09 DIAGNOSIS — M23329 Other meniscus derangements, posterior horn of medial meniscus, unspecified knee: Secondary | ICD-10-CM

## 2011-07-09 DIAGNOSIS — M238X9 Other internal derangements of unspecified knee: Secondary | ICD-10-CM

## 2011-07-09 DIAGNOSIS — M25369 Other instability, unspecified knee: Secondary | ICD-10-CM

## 2011-07-09 NOTE — Progress Notes (Signed)
Patient ID: Christie Manning, female   DOB: 1961-04-06, 51 y.o.   MRN: [id number] Chief Complaint  Patient presents with  . Follow-up    4 week recheck Lt knee  . Knee Injury    right knee      hpi This is a 51 year old female who hit her knee against a dresser twisted fell and since that time has had increasing medial knee pain swelling stiffness and giving out of the knee.  Injured in mid December and has not had any relief since that time despite over-the-counter medicine such as Tylenol and Advil  We treated her for LEFT leg sciatica which resolved  Review of systems pertinent neurologic symptoms negative cardiovascular symptoms negative  Exam moderately obese black female vital signs stable oriented x3 mood and affect normal ambulates with a limp  RIGHT knee flexion pain at 125 medial joint line tenderness, bursal tenderness, iliotibial band tenderness.  Knee is stable in terms of the ACL PCL and collaterals.  Muscle tone normal.  Skin normal.  Pulse and temperature normal.  Sensation normal.  Provocative test McMurray sign positive, hyperflexion test positive, screwed home test positive.  Impression torn medial meniscus RIGHT knee  Plan MRI RIGHT knee

## 2011-07-09 NOTE — Patient Instructions (Signed)
MRI will be arranged after insurance pre-certification  Apply ice to the knee as needed   Take over the counter tylenol or aleve for pain

## 2011-07-24 ENCOUNTER — Other Ambulatory Visit (HOSPITAL_COMMUNITY): Payer: PRIVATE HEALTH INSURANCE

## 2011-07-24 ENCOUNTER — Ambulatory Visit (HOSPITAL_COMMUNITY): Payer: PRIVATE HEALTH INSURANCE

## 2011-07-27 ENCOUNTER — Telehealth: Payer: Self-pay | Admitting: Radiology

## 2011-07-27 NOTE — Telephone Encounter (Signed)
I called to give the patient her MRI appointment at [facility] and she was going to call them back and reschedul. She has [employer] insurance, no precert needed per [facility].

## 2011-07-28 ENCOUNTER — Ambulatory Visit: Payer: PRIVATE HEALTH INSURANCE | Admitting: Orthopedic Surgery

## 2012-01-08 ENCOUNTER — Other Ambulatory Visit: Payer: Self-pay | Admitting: Obstetrics and Gynecology

## 2012-01-08 DIAGNOSIS — Z139 Encounter for screening, unspecified: Secondary | ICD-10-CM

## 2012-01-08 DIAGNOSIS — N83209 Unspecified ovarian cyst, unspecified side: Secondary | ICD-10-CM | POA: Insufficient documentation

## 2012-01-08 DIAGNOSIS — Z1231 Encounter for screening mammogram for malignant neoplasm of breast: Secondary | ICD-10-CM

## 2012-01-12 ENCOUNTER — Ambulatory Visit: Payer: PRIVATE HEALTH INSURANCE | Admitting: Obstetrics and Gynecology

## 2012-02-01 ENCOUNTER — Encounter: Payer: Self-pay | Admitting: Obstetrics and Gynecology

## 2012-02-01 ENCOUNTER — Ambulatory Visit (INDEPENDENT_AMBULATORY_CARE_PROVIDER_SITE_OTHER): Payer: PRIVATE HEALTH INSURANCE | Admitting: Obstetrics and Gynecology

## 2012-02-01 VITALS — BP 118/74 | Ht 64.6 in | Wt 264.0 lb

## 2012-02-01 DIAGNOSIS — Z01419 Encounter for gynecological examination (general) (routine) without abnormal findings: Secondary | ICD-10-CM

## 2012-02-01 DIAGNOSIS — E669 Obesity, unspecified: Secondary | ICD-10-CM

## 2012-02-01 DIAGNOSIS — Z90710 Acquired absence of both cervix and uterus: Secondary | ICD-10-CM | POA: Insufficient documentation

## 2012-02-01 NOTE — Progress Notes (Signed)
Subjective:    Christie Manning is a 51 y.o. female G2P2 who presents for annual exam. The patient is recovering from an injured knee and foot.  She is status post hysterectomy.  The following portions of the patient's history were reviewed and updated as appropriate: allergies, current medications, past family history, past medical history, past social history, past surgical history and problem list. See above.  Review of Systems Pertinent items are noted in HPI. Gastrointestinal:No change in bowel habits, no abdominal pain, no rectal bleeding Genitourinary:negative for dysuria, frequency, hematuria, nocturia and urinary incontinence    Objective:     BP 118/74  Ht 5' 4.6" (1.641 m)  Wt 264 lb (119.75 kg)  BMI 44.48 kg/m2  Weight:  Wt Readings from Last 1 Encounters:  02/01/12 264 lb (119.75 kg)     BMI: Body mass index is 44.48 kg/(m^2). General Appearance: Alert, appropriate appearance for age. No acute distress HEENT: Grossly normal Neck / Thyroid: Supple, no masses, nodes or enlargement Lungs: clear to auscultation bilaterally Back: No CVA tenderness Breast Exam: No masses or nodes.No dimpling, nipple retraction or discharge. Cardiovascular: Regular rate and rhythm. S1, S2, no murmur Gastrointestinal: Soft, non-tender, no masses or organomegaly  ++++++++++++++++++++++++++++++++++++++++++++++++++++++++  Pelvic Exam: External genitalia: normal general appearance Vaginal: atrophic mucosa Cervix: absent Adnexa: normal bimanual exam Uterus: absent Rectovaginal: normal rectal, no masses  ++++++++++++++++++++++++++++++++++++++++++++++++++++++++  Lymphatic Exam: Non-palpable nodes in neck, clavicular, axillary, or inguinal regions  Psychiatric: Alert and oriented, appropriate affect.    Urinalysis:Not done      Assessment:    Normal gyn exam   Overweight or obese: Yes  Pelvic relaxation: Yes  Menopausal symptoms: Yes. Severe: No.   Plan:    weight loss  and exercise reviewed   Follow-up:  for annual exam  STD screen request: none,  RPR: No,  HBsAg: No.  Hepatitis C: No.  The updated Pap smear screening guidelines were discussed with the patient. The patient requested that I obtain a Pap smear: No.  Kegel exercises discussed: Yes.  Proper diet and regular exercise were reviewed.  Annual mammograms recommended starting at age 33. Proper breast care was discussed.  Screening colonoscopy is recommended beginning at age 60.  Regular health maintenance was reviewed.  Sleep hygiene was discussed.  Leonard Schwartz M.D.   Screening questions:  Last Pap: 10/03/2008 WNL: Yes Regular Periods:no Contraception: HYST  Monthly Breast exam:yes Tetanus<41yrs:yes Nl.Bladder Function:yes Daily BMs:no Healthy Diet:yes Calcium:yes Mammogram:yes Date of Mammogram: 04/2011 Exercise:no Have often Exercise: walk hurt knee  Seatbelt: yes Abuse at home: no Stressful work:no Sigmoid-colonoscopy: 3 years ago  Bone Density: No PCP: Dr. Phillips Odor  Change in PMH: none Change in [facility] none

## 2012-05-23 ENCOUNTER — Ambulatory Visit (HOSPITAL_COMMUNITY): Payer: PRIVATE HEALTH INSURANCE

## 2012-06-10 ENCOUNTER — Other Ambulatory Visit: Payer: Self-pay | Admitting: Obstetrics and Gynecology

## 2012-06-10 ENCOUNTER — Ambulatory Visit: Payer: Self-pay

## 2012-06-10 ENCOUNTER — Other Ambulatory Visit (HOSPITAL_COMMUNITY): Payer: Self-pay | Admitting: Obstetrics and Gynecology

## 2012-06-10 DIAGNOSIS — Z139 Encounter for screening, unspecified: Secondary | ICD-10-CM

## 2012-06-14 ENCOUNTER — Ambulatory Visit (HOSPITAL_COMMUNITY): Payer: Self-pay

## 2012-06-17 ENCOUNTER — Ambulatory Visit (HOSPITAL_COMMUNITY)
Admission: RE | Admit: 2012-06-17 | Discharge: 2012-06-17 | Disposition: A | Discharge status: Home / Self Care | Payer: PRIVATE HEALTH INSURANCE | Source: Ambulatory Visit | Attending: Obstetrics and Gynecology | Admitting: Obstetrics and Gynecology

## 2012-06-17 DIAGNOSIS — Z1231 Encounter for screening mammogram for malignant neoplasm of breast: Secondary | ICD-10-CM | POA: Insufficient documentation

## 2012-06-17 DIAGNOSIS — Z139 Encounter for screening, unspecified: Secondary | ICD-10-CM

## 2012-06-21 ENCOUNTER — Ambulatory Visit: Payer: Self-pay

## 2012-08-24 ENCOUNTER — Ambulatory Visit: Payer: PRIVATE HEALTH INSURANCE | Admitting: Orthopedic Surgery

## 2012-08-27 DIAGNOSIS — N6452 Nipple discharge: Secondary | ICD-10-CM

## 2012-08-27 HISTORY — DX: Nipple discharge: N64.52

## 2012-09-12 ENCOUNTER — Encounter (INDEPENDENT_AMBULATORY_CARE_PROVIDER_SITE_OTHER): Payer: Self-pay | Admitting: Surgery

## 2012-09-12 ENCOUNTER — Ambulatory Visit (INDEPENDENT_AMBULATORY_CARE_PROVIDER_SITE_OTHER): Payer: PRIVATE HEALTH INSURANCE | Admitting: Surgery

## 2012-09-12 VITALS — BP 128/78 | HR 89 | Temp 97.2°F | Resp 18 | Ht 64.0 in | Wt 268.0 lb

## 2012-09-12 DIAGNOSIS — N6459 Other signs and symptoms in breast: Secondary | ICD-10-CM

## 2012-09-12 DIAGNOSIS — N6452 Nipple discharge: Secondary | ICD-10-CM

## 2012-09-12 NOTE — Progress Notes (Signed)
Patient ID: Christie Manning, female   DOB: 12/21/1960, 52 y.o.   MRN: [id number]  Chief Complaint  Patient presents with  . New Evaluation    eval Rt nipple discharge    HPI Christie Manning is a 52 y.o. female.  Patient presents with a chief complaint of right breast nipple discharge. She was seen here in the past the same condition back in 2011. Workup was negative and ductogram could not be done. The drainage has persisted has become more heavy. She has intermittent bloody drainage the right nipple mixed with clear drainage from time to time. Her last mammogram in December 2013 was BI-RADS 1. HPI  Past Medical History  Diagnosis Date  . Multiple myeloma   . Ovarian cyst     Past Surgical History  Procedure Laterality Date  . Abdominal hysterectomy    . Tubal ligation    . Carpal tunnel release    . Gallbladder surgery      Family History  Problem Relation Age of Onset  . Diabetes Mother   . Diabetes Sister   . Hypertension Sister   . Hypertension Brother   . Cancer Maternal Grandmother   . Hypertension Brother     Social History History  Substance Use Topics  . Smoking status: Never Smoker   . Smokeless tobacco: Never Used  . Alcohol Use: No    Allergies  Allergen Reactions  . Aspirin     Current Outpatient Prescriptions  Medication Sig Dispense Refill  . Ascorbic Acid (VITAMIN C PO) Take by mouth daily.      . Cyanocobalamin (VITAMIN B 12 PO) Take by mouth daily.      . fish oil-omega-3 fatty acids 1000 MG capsule Take by mouth daily.      . Nutritional Supplements (ESTROVEN PO) Take by mouth daily.      . VITAMIN D, CHOLECALCIFEROL, PO Take by mouth daily.       No current facility-administered medications for this visit.    Review of Systems Review of Systems  Constitutional: Negative for fever, chills and unexpected weight change.  HENT: Negative for hearing loss, congestion, sore throat, trouble swallowing and voice change.   Eyes:  Negative for visual disturbance.  Respiratory: Negative for cough and wheezing.   Cardiovascular: Negative for chest pain, palpitations and leg swelling.  Gastrointestinal: Negative for nausea, vomiting, abdominal pain, diarrhea, constipation, blood in stool, abdominal distention and anal bleeding.  Genitourinary: Negative for hematuria, vaginal bleeding and difficulty urinating.  Musculoskeletal: Negative for arthralgias.  Skin: Negative for rash and wound.  Neurological: Negative for seizures, syncope and headaches.  Hematological: Negative for adenopathy. Does not bruise/bleed easily.  Psychiatric/Behavioral: Negative for confusion.    Blood pressure 128/78, pulse 89, temperature 97.2 F (36.2 C), temperature source Temporal, resp. rate 18, height 5' 4" (1.626 m), weight 268 lb (121.564 kg).  Physical Exam Physical Exam  Constitutional: She is oriented to person, place, and time.  HENT:  Head: Normocephalic and atraumatic.  Eyes: EOM are normal. Pupils are equal, round, and reactive to light.  Neck: Normal range of motion. Neck supple.  Cardiovascular: Normal rate and regular rhythm.   Pulmonary/Chest: Effort normal. Right breast exhibits nipple discharge. Right breast exhibits no inverted nipple, no mass, no skin change and no tenderness. Left breast exhibits no inverted nipple, no mass, no nipple discharge, no skin change and no tenderness. Breasts are symmetrical.    Musculoskeletal: Normal range of motion.  Neurological: She is alert and   oriented to person, place, and time.  Skin: Skin is warm and dry.  Psychiatric: She has a normal mood and affect. Her behavior is normal. Judgment and thought content normal.    Data Reviewed DIGITAL BILATERAL SCREENING MAMMOGRAM WITH CAD  Comparison: 05/28/2009  FINDINGS:  ACR Breast Density Category 1: The breast tissue is almost entirely  fatty.  There is no suspicious dominant mass, architectural distortion, or  calcification to  suggest malignancy.  Images were processed with CAD.  IMPRESSION:  No mammographic evidence of malignancy.  A result letter of this screening mammogram will be mailed directly  to the patient.  RECOMMENDATION:  Screening mammogram in one year. (Code:SM-B-01Y)  BI-RADS CATEGORY 1: Negative.    Assessment    Right nipple discharge with negative work up persistent since 2011    Plan    Patient would like to have surgery to resolve this issue of persistent right breast nipple discharge. Recommend right breast central duct excision. Risk of bleeding, infection, nipple loss, pain, and the need for other operations discussed. The effectiveness of the surgery at treating his condition is 50-90%.The procedure has been discussed with the patient.  Alternative therapies have been discussed with the patient.  Operative risks include bleeding,  Infection,  Organ injury,  Nerve injury,  Blood vessel injury,  DVT,  Pulmonary embolism,  Death,  And possible reoperation.  Medical management risks include worsening of present situation.  The success of the procedure is 50 -90 % at treating patients symptoms.  The patient understands and agrees to proceed.       Janaria Mccammon A. 09/12/2012, 3:42 PM    

## 2012-09-12 NOTE — Patient Instructions (Signed)
Excision of milk duct from right breast will be scheduled. The procedure has been discussed with the patient.  Alternative therapies have been discussed with the patient.  Operative risks include bleeding,  Infection,  Organ injury,   And possible reoperation.  Medical management risks include worsening of present situation.  The success of the procedure is 50 -90 % at treating patients symptoms.

## 2012-09-22 ENCOUNTER — Encounter (HOSPITAL_BASED_OUTPATIENT_CLINIC_OR_DEPARTMENT_OTHER): Payer: Self-pay | Admitting: *Deleted

## 2012-09-22 NOTE — Pre-Procedure Instructions (Signed)
To come for CBC, diff, CMET, CXR 

## 2012-09-26 ENCOUNTER — Encounter (HOSPITAL_BASED_OUTPATIENT_CLINIC_OR_DEPARTMENT_OTHER)
Admission: RE | Admit: 2012-09-26 | Discharge: 2012-09-26 | Disposition: A | Discharge status: Home / Self Care | Payer: PRIVATE HEALTH INSURANCE | Source: Ambulatory Visit | Attending: Surgery | Admitting: Surgery

## 2012-09-26 ENCOUNTER — Ambulatory Visit
Admission: RE | Admit: 2012-09-26 | Discharge: 2012-09-26 | Disposition: A | Discharge status: Home / Self Care | Payer: PRIVATE HEALTH INSURANCE | Source: Ambulatory Visit | Attending: Surgery | Admitting: Surgery

## 2012-09-26 LAB — COMPREHENSIVE METABOLIC PANEL
CO2: 30 mEq/L (ref 19–32)
Calcium: 10.1 mg/dL (ref 8.4–10.5)
Creatinine, Ser: 0.72 mg/dL (ref 0.50–1.10)
GFR calc Af Amer: >90 mL/min (ref >90)
GFR calc non Af Amer: >90 mL/min (ref >90)
Glucose, Bld: 101 mg/dL — ABNORMAL HIGH (ref 70–99)

## 2012-09-26 LAB — CBC WITH DIFFERENTIAL/PLATELET
Eosinophils Relative: 2 % (ref 0–5)
HCT: 37.4 % (ref 36.0–46.0)
Lymphocytes Relative: 46 % (ref 12–46)
Lymphs Abs: 2.2 10*3/uL (ref 0.7–4.0)
MCV: 89.3 fL (ref 78.0–100.0)
Monocytes Absolute: 0.3 10*3/uL (ref 0.1–1.0)
RBC: 4.19 MIL/uL (ref 3.87–5.11)
RDW: 13.1 % (ref 11.5–15.5)
WBC: 4.7 10*3/uL (ref 4.0–10.5)

## 2012-09-28 ENCOUNTER — Ambulatory Visit (HOSPITAL_BASED_OUTPATIENT_CLINIC_OR_DEPARTMENT_OTHER)
Admission: RE | Admit: 2012-09-28 | Discharge: 2012-09-28 | Disposition: A | Discharge status: Home / Self Care | Payer: PRIVATE HEALTH INSURANCE | Source: Ambulatory Visit | Attending: Surgery | Admitting: Surgery

## 2012-09-28 ENCOUNTER — Ambulatory Visit (HOSPITAL_BASED_OUTPATIENT_CLINIC_OR_DEPARTMENT_OTHER): Payer: PRIVATE HEALTH INSURANCE | Admitting: Anesthesiology

## 2012-09-28 ENCOUNTER — Encounter (HOSPITAL_BASED_OUTPATIENT_CLINIC_OR_DEPARTMENT_OTHER)
Admission: RE | Disposition: A | Discharge status: Home / Self Care | Payer: Self-pay | Source: Ambulatory Visit | Attending: Surgery

## 2012-09-28 ENCOUNTER — Encounter (HOSPITAL_BASED_OUTPATIENT_CLINIC_OR_DEPARTMENT_OTHER): Payer: Self-pay | Admitting: *Deleted

## 2012-09-28 ENCOUNTER — Encounter (HOSPITAL_BASED_OUTPATIENT_CLINIC_OR_DEPARTMENT_OTHER): Payer: Self-pay | Admitting: Anesthesiology

## 2012-09-28 DIAGNOSIS — M25569 Pain in unspecified knee: Secondary | ICD-10-CM

## 2012-09-28 DIAGNOSIS — S92919A Unspecified fracture of unspecified toe(s), initial encounter for closed fracture: Secondary | ICD-10-CM

## 2012-09-28 DIAGNOSIS — C9000 Multiple myeloma not having achieved remission: Secondary | ICD-10-CM | POA: Insufficient documentation

## 2012-09-28 DIAGNOSIS — M2240 Chondromalacia patellae, unspecified knee: Secondary | ICD-10-CM

## 2012-09-28 DIAGNOSIS — D249 Benign neoplasm of unspecified breast: Secondary | ICD-10-CM

## 2012-09-28 DIAGNOSIS — Z809 Family history of malignant neoplasm, unspecified: Secondary | ICD-10-CM | POA: Insufficient documentation

## 2012-09-28 DIAGNOSIS — K219 Gastro-esophageal reflux disease without esophagitis: Secondary | ICD-10-CM | POA: Insufficient documentation

## 2012-09-28 DIAGNOSIS — Z886 Allergy status to analgesic agent status: Secondary | ICD-10-CM | POA: Insufficient documentation

## 2012-09-28 DIAGNOSIS — N6049 Mammary duct ectasia of unspecified breast: Secondary | ICD-10-CM | POA: Insufficient documentation

## 2012-09-28 DIAGNOSIS — N83209 Unspecified ovarian cyst, unspecified side: Secondary | ICD-10-CM

## 2012-09-28 DIAGNOSIS — Z90710 Acquired absence of both cervix and uterus: Secondary | ICD-10-CM

## 2012-09-28 DIAGNOSIS — E669 Obesity, unspecified: Secondary | ICD-10-CM

## 2012-09-28 DIAGNOSIS — M76899 Other specified enthesopathies of unspecified lower limb, excluding foot: Secondary | ICD-10-CM

## 2012-09-28 DIAGNOSIS — Z79899 Other long term (current) drug therapy: Secondary | ICD-10-CM | POA: Insufficient documentation

## 2012-09-28 DIAGNOSIS — N6019 Diffuse cystic mastopathy of unspecified breast: Secondary | ICD-10-CM | POA: Insufficient documentation

## 2012-09-28 HISTORY — DX: Pure hypercholesterolemia, unspecified: E78.00

## 2012-09-28 HISTORY — DX: Nausea with vomiting, unspecified: R11.2

## 2012-09-28 HISTORY — DX: Dizziness and giddiness: R42

## 2012-09-28 HISTORY — DX: Other sleep disorders: G47.8

## 2012-09-28 HISTORY — PX: BREAST DUCTAL SYSTEM EXCISION: SHX5242

## 2012-09-28 HISTORY — DX: Gastro-esophageal reflux disease without esophagitis: K21.9

## 2012-09-28 HISTORY — DX: Other specified postprocedural states: Z98.890

## 2012-09-28 HISTORY — DX: Nipple discharge: N64.52

## 2012-09-28 SURGERY — EXCISION DUCTAL SYSTEM BREAST
Anesthesia: General | Site: Breast | Laterality: Right | Wound class: Clean

## 2012-09-28 MED ORDER — HYDROMORPHONE HCL PF 1 MG/ML IJ SOLN
0.2500 mg | INTRAMUSCULAR | Status: DC | PRN
  Administered 2012-09-28 (×2): 0.5 mg via INTRAVENOUS

## 2012-09-28 MED ORDER — DEXAMETHASONE SODIUM PHOSPHATE 4 MG/ML IJ SOLN
INTRAMUSCULAR | Status: DC | PRN
  Administered 2012-09-28: 10 mg via INTRAVENOUS

## 2012-09-28 MED ORDER — FENTANYL CITRATE 0.05 MG/ML IJ SOLN: 50.0000 ug | INTRAMUSCULAR | Status: DC | PRN

## 2012-09-28 MED ORDER — ACETAMINOPHEN 10 MG/ML IV SOLN
1000.0000 mg | Freq: Once | INTRAVENOUS | Status: AC
  Administered 2012-09-28: 1000 mg via INTRAVENOUS

## 2012-09-28 MED ORDER — CEFAZOLIN SODIUM 10 G IJ SOLR
3.0000 g | INTRAMUSCULAR | Status: AC
  Administered 2012-09-28: 3 g via INTRAVENOUS

## 2012-09-28 MED ORDER — OXYCODONE HCL 5 MG PO TABS: 5.0000 mg | ORAL_TABLET | Freq: Once | ORAL | Status: DC | PRN

## 2012-09-28 MED ORDER — LACTATED RINGERS IV SOLN
INTRAVENOUS | Status: DC
  Administered 2012-09-28 (×2): via INTRAVENOUS

## 2012-09-28 MED ORDER — PROMETHAZINE HCL 25 MG/ML IJ SOLN
6.2500 mg | Freq: Once | INTRAMUSCULAR | Status: AC
  Administered 2012-09-28: 6.25 mg via INTRAVENOUS

## 2012-09-28 MED ORDER — MIDAZOLAM HCL 2 MG/ML PO SYRP: 12.0000 mg | ORAL_SOLUTION | Freq: Once | ORAL | Status: DC | PRN

## 2012-09-28 MED ORDER — FENTANYL CITRATE 0.05 MG/ML IJ SOLN
INTRAMUSCULAR | Status: DC | PRN
  Administered 2012-09-28: 100 ug via INTRAVENOUS

## 2012-09-28 MED ORDER — OXYCODONE HCL 5 MG/5ML PO SOLN: 5.0000 mg | Freq: Once | ORAL | Status: DC | PRN

## 2012-09-28 MED ORDER — PROPOFOL 10 MG/ML IV BOLUS
INTRAVENOUS | Status: DC | PRN
  Administered 2012-09-28: 200 mg via INTRAVENOUS

## 2012-09-28 MED ORDER — OXYCODONE-ACETAMINOPHEN 5-325 MG PO TABS: 1.0000 | ORAL_TABLET | ORAL | Status: DC | PRN

## 2012-09-28 MED ORDER — MIDAZOLAM HCL 2 MG/2ML IJ SOLN
1.0000 mg | INTRAMUSCULAR | Status: DC | PRN
Start: 2012-09-28 — End: 2012-09-28

## 2012-09-28 MED ORDER — BUPIVACAINE HCL 0.25 % IJ SOLN
INTRAMUSCULAR | Status: DC | PRN
  Administered 2012-09-28: 10 mL

## 2012-09-28 MED ORDER — CHLORHEXIDINE GLUCONATE 4 % EX LIQD: 1.0000 "application " | Freq: Once | CUTANEOUS | Status: DC

## 2012-09-28 MED ORDER — LIDOCAINE HCL (CARDIAC) 20 MG/ML IV SOLN
INTRAVENOUS | Status: DC | PRN
  Administered 2012-09-28: 75 mg via INTRAVENOUS

## 2012-09-28 MED ORDER — ONDANSETRON HCL 4 MG/2ML IJ SOLN
INTRAMUSCULAR | Status: DC | PRN
  Administered 2012-09-28: 4 mg via INTRAVENOUS

## 2012-09-28 SURGICAL SUPPLY — 43 items
ADH SKN CLS APL DERMABOND .7 (GAUZE/BANDAGES/DRESSINGS) ×1
BLADE SURG 15 STRL LF DISP TIS (BLADE) ×1 IMPLANT
BLADE SURG 15 STRL SS (BLADE) ×2
CANISTER SUCTION 1200CC (MISCELLANEOUS) ×2 IMPLANT
CHLORAPREP W/TINT 26ML (MISCELLANEOUS) ×2 IMPLANT
CLIP TI WIDE RED SMALL 6 (CLIP) IMPLANT
CLOTH BEACON ORANGE TIMEOUT ST (SAFETY) ×2 IMPLANT
COVER MAYO STAND STRL (DRAPES) ×2 IMPLANT
COVER TABLE BACK 60X90 (DRAPES) ×2 IMPLANT
DECANTER SPIKE VIAL GLASS SM (MISCELLANEOUS) ×1 IMPLANT
DERMABOND ADVANCED (GAUZE/BANDAGES/DRESSINGS) ×1
DERMABOND ADVANCED .7 DNX12 (GAUZE/BANDAGES/DRESSINGS) ×1 IMPLANT
DEVICE DUBIN W/COMP PLATE 8390 (MISCELLANEOUS) IMPLANT
DRAPE LAPAROSCOPIC ABDOMINAL (DRAPES) IMPLANT
DRAPE PED LAPAROTOMY (DRAPES) ×2 IMPLANT
DRAPE UTILITY XL STRL (DRAPES) ×2 IMPLANT
ELECT COATED BLADE 2.86 ST (ELECTRODE) ×2 IMPLANT
ELECT REM PT RETURN 9FT ADLT (ELECTROSURGICAL) ×2
ELECTRODE REM PT RTRN 9FT ADLT (ELECTROSURGICAL) ×1 IMPLANT
GLOVE BIO SURGEON STRL SZ 6.5 (GLOVE) ×1 IMPLANT
GLOVE BIOGEL M STRL SZ7.5 (GLOVE) ×1 IMPLANT
GLOVE BIOGEL PI IND STRL 8 (GLOVE) ×1 IMPLANT
GLOVE BIOGEL PI INDICATOR 8 (GLOVE) ×2
GLOVE ECLIPSE 8.0 STRL XLNG CF (GLOVE) ×2 IMPLANT
GOWN PREVENTION PLUS XLARGE (GOWN DISPOSABLE) ×3 IMPLANT
GOWN PREVENTION PLUS XXLARGE (GOWN DISPOSABLE) ×1 IMPLANT
NDL HYPO 25X1 1.5 SAFETY (NEEDLE) ×1 IMPLANT
NEEDLE HYPO 25X1 1.5 SAFETY (NEEDLE) ×2 IMPLANT
NS IRRIG 1000ML POUR BTL (IV SOLUTION) ×2 IMPLANT
PACK BASIN DAY SURGERY FS (CUSTOM PROCEDURE TRAY) ×2 IMPLANT
PENCIL BUTTON HOLSTER BLD 10FT (ELECTRODE) ×2 IMPLANT
SLEEVE SCD COMPRESS KNEE MED (MISCELLANEOUS) ×2 IMPLANT
SPONGE LAP 4X18 X RAY DECT (DISPOSABLE) ×2 IMPLANT
STAPLER VISISTAT 35W (STAPLE) IMPLANT
SUT MON AB 4-0 PC3 18 (SUTURE) ×2 IMPLANT
SUT SILK 2 0 SH (SUTURE) IMPLANT
SUT VIC AB 3-0 SH 27 (SUTURE) ×2
SUT VIC AB 3-0 SH 27X BRD (SUTURE) ×1 IMPLANT
SYR CONTROL 10ML LL (SYRINGE) ×2 IMPLANT
TOWEL OR 17X24 6PK STRL BLUE (TOWEL DISPOSABLE) ×4 IMPLANT
TOWEL OR NON WOVEN STRL DISP B (DISPOSABLE) ×2 IMPLANT
TUBE CONNECTING 20X1/4 (TUBING) ×2 IMPLANT
YANKAUER SUCT BULB TIP NO VENT (SUCTIONS) ×2 IMPLANT

## 2012-09-28 NOTE — Anesthesia Preprocedure Evaluation (Signed)
Anesthesia Evaluation  Patient identified by MRN, date of birth, ID band Patient awake    Reviewed: Allergy & Precautions, H&P , NPO status , Patient's Chart, lab work & pertinent test results  History of Anesthesia Complications (+) PONV  Airway Mallampati: II TM Distance: >3 FB Neck ROM: Full    Dental no notable dental hx. (+) Teeth Intact and Dental Advisory Given   Pulmonary neg pulmonary ROS,  breath sounds clear to auscultation  Pulmonary exam normal       Cardiovascular negative cardio ROS  Rhythm:Regular Rate:Normal     Neuro/Psych negative neurological ROS  negative psych ROS   GI/Hepatic Neg liver ROS, GERD-  Controlled,  Endo/Other  Morbid obesity  Renal/GU negative Renal ROS  negative genitourinary   Musculoskeletal   Abdominal   Peds  Hematology negative hematology ROS (+)   Anesthesia Other Findings   Reproductive/Obstetrics negative OB ROS                           Anesthesia Physical Anesthesia Plan  ASA: III  Anesthesia Plan: General   Post-op Pain Management:    Induction: Intravenous  Airway Management Planned: LMA  Additional Equipment:   Intra-op Plan:   Post-operative Plan: Extubation in OR  Informed Consent: I have reviewed the patients History and Physical, chart, labs and discussed the procedure including the risks, benefits and alternatives for the proposed anesthesia with the patient or authorized representative who has indicated his/her understanding and acceptance.   Dental advisory given  Plan Discussed with: CRNA  Anesthesia Plan Comments:         Anesthesia Quick Evaluation

## 2012-09-28 NOTE — Anesthesia Postprocedure Evaluation (Signed)
  Anesthesia Post-op Note  Patient: Christie Manning  Procedure(s) Performed: Procedure(s): RIGHT BREAST CENTRAL DUCTAL EXCISION   (Right)  Patient Location: PACU  Anesthesia Type:General  Level of Consciousness: awake and alert   Airway and Oxygen Therapy: Patient Spontanous Breathing  Post-op Pain: mild  Post-op Assessment: Post-op Vital signs reviewed, Patient's Cardiovascular Status Stable, Respiratory Function Stable and Patent Airway  Post-op Vital Signs: Reviewed and stable  Complications: No apparent anesthesia complications

## 2012-09-28 NOTE — Anesthesia Procedure Notes (Signed)
Procedure Name: LMA Insertion Date/Time: 09/28/2012 10:41 AM Performed by: Gar Gibbon Pre-anesthesia Checklist: Patient identified, Emergency Drugs available, Suction available and Patient being monitored Patient Re-evaluated:Patient Re-evaluated prior to inductionOxygen Delivery Method: Circle System Utilized Preoxygenation: Pre-oxygenation with 100% oxygen Intubation Type: IV induction Ventilation: Mask ventilation without difficulty LMA: LMA with gastric port inserted LMA Size: 4.0 Number of attempts: 1 Placement Confirmation: positive ETCO2 Tube secured with: Tape Dental Injury: Teeth and Oropharynx as per pre-operative assessment

## 2012-09-28 NOTE — Op Note (Signed)
Preop diagnosis:  Right Breast  nipple discharge  Postop diagnosis: Same  Procedure: Right  Central breast duct excision  Surgeon: Harriette Bouillon M.D.  Anesthesia: Gen. endotracheal with local anesthesia  EBL: Minimal  Specimen: Breast tissue to pathology  Drains: None  Indications for procedure: The patient presents for central duct excision of the breast due to persistent nipple discharge for years despite negative Work up. Workup included mammography and ultrasound. Discussion of operative and nonoperative means were done with the patient. Risks of operative excision include bleeding, infection, recurrence, and the need for the surgery, nipple loss,   seroma, anesthesia risks. Observation is the other choice discussed. The patient wishes to proceed for excision.  Description of procedure: The patient was seen all the area. Questions are answered. The  Right breast was marked. The patient was taken back to the operating room. The patient was placed supine on the operating room table. General anesthesia was initiated. Upper chest was prepped and draped in a sterile fashion. A timeout was done. She received preoperative antibiotics within an hour of incision. A curvilinear incision was made along the border of the nipple. The nipple was elevated carefully. The central duct was identified  Using a probe and correlated with the discharge. The duct and neighboring tissue were excised. This was sent to pathology. The wound was irrigated and then closed with 3-0 Vicryl and 4-0 Monocryl in a subcuticular fashion Dermabond was applied. Patient was awoke and taken to recovery in satisfactory condition. All final counts the sponge, instruments and needles were correct.

## 2012-09-28 NOTE — H&P (View-Only) (Signed)
Patient ID: Christie Manning, female   DOB: 02/03/1961, 52 y.o.   MRN: [id number]  Chief Complaint  Patient presents with  . New Evaluation    eval Rt nipple discharge    HPI Christie Manning is a 52 y.o. female.  Patient presents with a chief complaint of right breast nipple discharge. She was seen here in the past the same condition back in 2011. Workup was negative and ductogram could not be done. The drainage has persisted has become more heavy. She has intermittent bloody drainage the right nipple mixed with clear drainage from time to time. Her last mammogram in December 2013 was BI-RADS 1. HPI  Past Medical History  Diagnosis Date  . Multiple myeloma   . Ovarian cyst     Past Surgical History  Procedure Laterality Date  . Abdominal hysterectomy    . Tubal ligation    . Carpal tunnel release    . Gallbladder surgery      Family History  Problem Relation Age of Onset  . Diabetes Mother   . Diabetes Sister   . Hypertension Sister   . Hypertension Brother   . Cancer Maternal Grandmother   . Hypertension Brother     Social History History  Substance Use Topics  . Smoking status: Never Smoker   . Smokeless tobacco: Never Used  . Alcohol Use: No    Allergies  Allergen Reactions  . Aspirin     Current Outpatient Prescriptions  Medication Sig Dispense Refill  . Ascorbic Acid (VITAMIN C PO) Take by mouth daily.      . Cyanocobalamin (VITAMIN B 12 PO) Take by mouth daily.      . fish oil-omega-3 fatty acids 1000 MG capsule Take by mouth daily.      . Nutritional Supplements (ESTROVEN PO) Take by mouth daily.      Marland Kitchen VITAMIN D, CHOLECALCIFEROL, PO Take by mouth daily.       No current facility-administered medications for this visit.    Review of Systems Review of Systems  Constitutional: Negative for fever, chills and unexpected weight change.  HENT: Negative for hearing loss, congestion, sore throat, trouble swallowing and voice change.   Eyes:  Negative for visual disturbance.  Respiratory: Negative for cough and wheezing.   Cardiovascular: Negative for chest pain, palpitations and leg swelling.  Gastrointestinal: Negative for nausea, vomiting, abdominal pain, diarrhea, constipation, blood in stool, abdominal distention and anal bleeding.  Genitourinary: Negative for hematuria, vaginal bleeding and difficulty urinating.  Musculoskeletal: Negative for arthralgias.  Skin: Negative for rash and wound.  Neurological: Negative for seizures, syncope and headaches.  Hematological: Negative for adenopathy. Does not bruise/bleed easily.  Psychiatric/Behavioral: Negative for confusion.    Blood pressure 128/78, pulse 89, temperature 97.2 F (36.2 C), temperature source Temporal, resp. rate 18, height 5\' 4"  (1.626 m), weight 268 lb (121.564 kg).  Physical Exam Physical Exam  Constitutional: She is oriented to person, place, and time.  HENT:  Head: Normocephalic and atraumatic.  Eyes: EOM are normal. Pupils are equal, round, and reactive to light.  Neck: Normal range of motion. Neck supple.  Cardiovascular: Normal rate and regular rhythm.   Pulmonary/Chest: Effort normal. Right breast exhibits nipple discharge. Right breast exhibits no inverted nipple, no mass, no skin change and no tenderness. Left breast exhibits no inverted nipple, no mass, no nipple discharge, no skin change and no tenderness. Breasts are symmetrical.    Musculoskeletal: Normal range of motion.  Neurological: She is alert and  oriented to person, place, and time.  Skin: Skin is warm and dry.  Psychiatric: She has a normal mood and affect. Her behavior is normal. Judgment and thought content normal.    Data Reviewed DIGITAL BILATERAL SCREENING MAMMOGRAM WITH CAD  Comparison: 05/28/2009  FINDINGS:  ACR Breast Density Category 1: The breast tissue is almost entirely  fatty.  There is no suspicious dominant mass, architectural distortion, or  calcification to  suggest malignancy.  Images were processed with CAD.  IMPRESSION:  No mammographic evidence of malignancy.  A result letter of this screening mammogram will be mailed directly  to the patient.  RECOMMENDATION:  Screening mammogram in one year. (Code:SM-B-01Y)  BI-RADS CATEGORY 1: Negative.    Assessment    Right nipple discharge with negative work up persistent since 2011    Plan    Patient would like to have surgery to resolve this issue of persistent right breast nipple discharge. Recommend right breast central duct excision. Risk of bleeding, infection, nipple loss, pain, and the need for other operations discussed. The effectiveness of the surgery at treating his condition is 50-90%.The procedure has been discussed with the patient.  Alternative therapies have been discussed with the patient.  Operative risks include bleeding,  Infection,  Organ injury,  Nerve injury,  Blood vessel injury,  DVT,  Pulmonary embolism,  Death,  And possible reoperation.  Medical management risks include worsening of present situation.  The success of the procedure is 50 -90 % at treating patients symptoms.  The patient understands and agrees to proceed.       Christie Rone A. 09/12/2012, 3:42 PM

## 2012-09-28 NOTE — Transfer of Care (Signed)
Immediate Anesthesia Transfer of Care Note  Patient: Christie Manning  Procedure(s) Performed: Procedure(s): RIGHT BREAST CENTRAL DUCTAL EXCISION   (Right)  Patient Location: PACU  Anesthesia Type:General  Level of Consciousness: sedated and patient cooperative  Airway & Oxygen Therapy: Patient Spontanous Breathing and Patient connected to face mask oxygen  Post-op Assessment: Report given to PACU RN and Post -op Vital signs reviewed and stable  Post vital signs: Reviewed and stable  Complications: No apparent anesthesia complications

## 2012-09-28 NOTE — Interval H&P Note (Signed)
History and Physical Interval Note:  09/28/2012 10:22 AM  Christie Manning  has presented today for surgery, with the diagnosis of right breast nipple discharge  The various methods of treatment have been discussed with the patient and family. After consideration of risks, benefits and other options for treatment, the patient has consented to  Procedure(s): RIGHT BREAST CENTRAL DUCTAL EXCISION   (Right) as a surgical intervention .  The patient's history has been reviewed, patient examined, no change in status, stable for surgery.  I have reviewed the patient's chart and labs.  Questions were answered to the patient's satisfaction.     Jaeda Bruso A.

## 2012-09-29 ENCOUNTER — Encounter (HOSPITAL_BASED_OUTPATIENT_CLINIC_OR_DEPARTMENT_OTHER): Payer: Self-pay | Admitting: Surgery

## 2012-09-29 ENCOUNTER — Telehealth (INDEPENDENT_AMBULATORY_CARE_PROVIDER_SITE_OTHER): Payer: Self-pay | Admitting: General Surgery

## 2012-09-29 NOTE — Telephone Encounter (Signed)
Spoke with patient she is aware of 10/24/12 appt

## 2012-09-30 ENCOUNTER — Telehealth (INDEPENDENT_AMBULATORY_CARE_PROVIDER_SITE_OTHER): Payer: Self-pay

## 2012-09-30 NOTE — Telephone Encounter (Signed)
Called pt with benign path results.  

## 2012-10-03 ENCOUNTER — Telehealth (INDEPENDENT_AMBULATORY_CARE_PROVIDER_SITE_OTHER): Payer: Self-pay | Admitting: General Surgery

## 2012-10-03 NOTE — Telephone Encounter (Signed)
Pt called to ask about her chest feeling tight and noted shortness of breath on Saturday and Sunday.  She denies fever and any other symptoms of infection at the surgical site.  Recommended she see her PCP for evaluation of lungs.  She agrees and will call them. FYI.

## 2012-10-24 ENCOUNTER — Encounter (INDEPENDENT_AMBULATORY_CARE_PROVIDER_SITE_OTHER): Payer: Self-pay | Admitting: Surgery

## 2012-10-24 ENCOUNTER — Ambulatory Visit (INDEPENDENT_AMBULATORY_CARE_PROVIDER_SITE_OTHER): Payer: PRIVATE HEALTH INSURANCE | Admitting: Surgery

## 2012-10-24 VITALS — BP 122/84 | HR 78 | Temp 97.9°F | Resp 18 | Ht 64.0 in | Wt 267.2 lb

## 2012-10-24 DIAGNOSIS — Z9889 Other specified postprocedural states: Secondary | ICD-10-CM

## 2012-10-24 NOTE — Progress Notes (Signed)
Patient returns after right breast central duct excision for papilloma. Pathology showed fibrocystic change, dilated ducts and papilloma without atypia.  Exam: Right breast incision clean dry intact.  Impression: Status post right breast central duct excision  Plan: Return as needed. Resume regular screening a mammogram.

## 2012-10-24 NOTE — Patient Instructions (Signed)
Return as needed

## 2012-11-08 ENCOUNTER — Ambulatory Visit: Payer: PRIVATE HEALTH INSURANCE | Admitting: Orthopedic Surgery

## 2012-11-29 ENCOUNTER — Encounter: Payer: Self-pay | Admitting: Orthopedic Surgery

## 2012-11-29 ENCOUNTER — Ambulatory Visit (INDEPENDENT_AMBULATORY_CARE_PROVIDER_SITE_OTHER): Payer: PRIVATE HEALTH INSURANCE | Admitting: Orthopedic Surgery

## 2012-11-29 VITALS — BP 144/88 | Ht 64.0 in | Wt 260.0 lb

## 2012-11-29 DIAGNOSIS — S83241A Other tear of medial meniscus, current injury, right knee, initial encounter: Secondary | ICD-10-CM

## 2012-11-29 DIAGNOSIS — S83249A Other tear of medial meniscus, current injury, unspecified knee, initial encounter: Secondary | ICD-10-CM | POA: Insufficient documentation

## 2012-11-29 DIAGNOSIS — IMO0002 Reserved for concepts with insufficient information to code with codable children: Secondary | ICD-10-CM

## 2012-11-29 NOTE — Patient Instructions (Signed)
MRI right knee  You have received a steroid shot. 15% of patients experience increased pain at the injection site with in the next 24 hours. This is best treated with ice and tylenol extra strength 2 tabs every 8 hours. If you are still having pain please call the office.

## 2012-11-29 NOTE — Progress Notes (Signed)
Patient ID: ELANI DELPH, female   DOB: July 12, 1960, 52 y.o.   MRN: [id number] Chief Complaint  Patient presents with  . Knee Pain    Right knee pain   Previous history This is a 52 year old female who hit her knee against a dresser twisted fell and since that time has had increasing medial knee pain swelling stiffness and giving out of the knee.  Injured in mid December and has not had any relief since that time despite over-the-counter medicine such as Tylenol and Advil  The patient complains of pain again in her right knee. She came in to request an injection. She is having medial joint line pain difficulty walking gait difficulty getting out of a chair.  Past Medical History  Diagnosis Date  . PONV (postoperative nausea and vomiting)   . Vertigo     occasional  . GERD (gastroesophageal reflux disease)     no current meds.  . Difficulty waking     from anesthesia  . Nipple discharge in female 08/2012    right  . Elevated cholesterol     no current meds.   Past Surgical History  Procedure Laterality Date  . Abdominal hysterectomy  age 70    complete  . Tubal ligation    . Carpal tunnel release Bilateral 2004  . Cholecystectomy    . Esophagogastroduodenoscopy (egd) with esophageal dilation  04/04/2003; 09/13/2001  . Breast ductal system excision Right 09/28/2012    Procedure: RIGHT BREAST CENTRAL DUCTAL EXCISION  ;  Surgeon: Clovis Pu. Cornett, MD;  Location: [facility];  Service: General;  Laterality: Right;    Vital signs are stable as recorded  General appearance is normal  The patient is alert and oriented x3  The patient's mood and affect are normal  Gait assessment: No assisted devices at this time The cardiovascular exam reveals normal pulses and temperature without edema or  swelling.  The lymphatic system is negative for palpable lymph nodes  The sensory exam is normal.  There are no pathologic reflexes.  Balance is normal.   Exam of  the right knee: No joint effusion is detected. She does have medial joint line tenderness. Range of motion is limited to 95 of flexion the knee is stable muscle tone and strength are normal skin is intact  Left knee no swelling normal range of motion no instability normal strength normal skin   Injection was given in the right knee Kne.e  Injection Procedure Note  Pre-operative Diagnosis: right knee oa  Post-operative Diagnosis: same  Indications: pain  Anesthesia: ethyl chloride   Procedure Details   Verbal consent was obtained for the procedure. Time out was completed.The joint was prepped with alcohol, followed by  Ethyl chloride spray and A 20 gauge needle was inserted into the knee via lateral approach; 4ml 1% lidocaine and 1 ml of depomedrol  was then injected into the joint . The needle was removed and the area cleansed and dressed.  Complications:  None; patient tolerated the procedure well.  We will also get an MRI of her right knee to evaluate for meniscal tear. We were going to do this back in January but she was having symptoms problems with her family and she declined at that time

## 2012-12-07 ENCOUNTER — Telehealth: Payer: Self-pay | Admitting: Radiology

## 2012-12-07 NOTE — Telephone Encounter (Signed)
Patient has MRI at [facility] on 12-17-12 at 2:45. Patient has [employer], authorization [id number]. Patient will follow back up here in the office for her results.

## 2012-12-17 ENCOUNTER — Ambulatory Visit
Admission: RE | Admit: 2012-12-17 | Discharge: 2012-12-17 | Disposition: A | Discharge status: Home / Self Care | Payer: PRIVATE HEALTH INSURANCE | Source: Ambulatory Visit | Attending: Orthopedic Surgery | Admitting: Orthopedic Surgery

## 2012-12-17 DIAGNOSIS — S83241A Other tear of medial meniscus, current injury, right knee, initial encounter: Secondary | ICD-10-CM

## 2012-12-27 ENCOUNTER — Ambulatory Visit: Payer: PRIVATE HEALTH INSURANCE | Admitting: Orthopedic Surgery

## 2012-12-28 ENCOUNTER — Encounter: Payer: Self-pay | Admitting: Orthopedic Surgery

## 2013-01-10 ENCOUNTER — Ambulatory Visit: Payer: PRIVATE HEALTH INSURANCE | Admitting: Orthopedic Surgery

## 2013-01-19 ENCOUNTER — Ambulatory Visit (INDEPENDENT_AMBULATORY_CARE_PROVIDER_SITE_OTHER): Payer: PRIVATE HEALTH INSURANCE

## 2013-01-19 ENCOUNTER — Ambulatory Visit (INDEPENDENT_AMBULATORY_CARE_PROVIDER_SITE_OTHER): Payer: PRIVATE HEALTH INSURANCE | Admitting: Orthopedic Surgery

## 2013-01-19 ENCOUNTER — Encounter: Payer: Self-pay | Admitting: Orthopedic Surgery

## 2013-01-19 VITALS — BP 130/86 | Ht 64.0 in | Wt 260.0 lb

## 2013-01-19 DIAGNOSIS — M25561 Pain in right knee: Secondary | ICD-10-CM

## 2013-01-19 DIAGNOSIS — M25569 Pain in unspecified knee: Secondary | ICD-10-CM

## 2013-01-19 DIAGNOSIS — M25562 Pain in left knee: Secondary | ICD-10-CM | POA: Insufficient documentation

## 2013-01-19 DIAGNOSIS — IMO0002 Reserved for concepts with insufficient information to code with codable children: Secondary | ICD-10-CM

## 2013-01-19 DIAGNOSIS — M1710 Unilateral primary osteoarthritis, unspecified knee: Secondary | ICD-10-CM

## 2013-01-19 DIAGNOSIS — M179 Osteoarthritis of knee, unspecified: Secondary | ICD-10-CM | POA: Insufficient documentation

## 2013-01-19 MED ORDER — ACETAMINOPHEN-CODEINE #3 300-30 MG PO TABS: 1.0000 | ORAL_TABLET | Freq: Four times a day (QID) | ORAL | Status: DC | PRN

## 2013-01-19 MED ORDER — DICLOFENAC POTASSIUM 50 MG PO TABS: 50.0000 mg | ORAL_TABLET | Freq: Two times a day (BID) | ORAL | Status: DC

## 2013-01-19 NOTE — Progress Notes (Signed)
Patient ID: Christie Manning, female   DOB: 1961/04/13, 52 y.o.   MRN: [id number] Chief Complaint  Patient presents with  . Follow-up    MRI results of her right knee.    The knee still hurts he can't do the things I want to. The patient still having knee pain primarily with sitting complains of diffuse pain and pain radiating now down into the foot no back pain no hip pain  Repeat x-ray today shows fairly significant amount of arthritis especially for the age of 72  I gave her options of injection versus arthroscopic surgery  She chose injection  Mechanical symptoms don't seem to be prominent  Her knee flexion is limited by the size and shape of the leg at approximately 115. The knee feels stable strength is normal scans intact she's tender medial and lateral she walks unsupported. Mood and affect are normal BP 130/86  Ht 5\' 4"  (1.626 m)  Wt 260 lb (117.935 kg)  BMI 44.61 kg/m2  Again x-ray shows fairly significant arthritis  She opted for injection Try medication as well as Tylenol with codeine and diclofenac we are aware of the aspirin allergy  Knee  Injection Procedure Note  Pre-operative Diagnosis: right knee oa  Post-operative Diagnosis: same  Indications: pain  Anesthesia: ethyl chloride   Procedure Details   Verbal consent was obtained for the procedure. Time out was completed.The joint was prepped with alcohol, followed by  Ethyl chloride spray and A 20 gauge needle was inserted into the knee via lateral approach; 4ml 1% lidocaine and 1 ml of depomedrol  was then injected into the joint . The needle was removed and the area cleansed and dressed.  Complications:  None; patient tolerated the procedure well.

## 2013-01-19 NOTE — Patient Instructions (Addendum)
Arthroscopic Procedure, Knee An arthroscopic procedure can find what is wrong with your knee. PROCEDURE Arthroscopy is a surgical technique that allows your orthopedic surgeon to diagnose and treat your knee injury with accuracy. They will look into your knee through a small instrument. This is almost like a small (pencil sized) telescope. Because arthroscopy affects your knee less than open knee surgery, you can anticipate a more rapid recovery. Taking an active role by following your caregiver's instructions will help with rapid and complete recovery. Use crutches, rest, elevation, ice, and knee exercises as instructed. The length of recovery depends on various factors including type of injury, age, physical condition, medical conditions, and your rehabilitation. Your knee is the joint between the large bones (femur and tibia) in your leg. Cartilage covers these bone ends which are smooth and slippery and allow your knee to bend and move smoothly. Two menisci, thick, semi-lunar shaped pads of cartilage which form a rim inside the joint, help absorb shock and stabilize your knee. Ligaments bind the bones together and support your knee joint. Muscles move the joint, help support your knee, and take stress off the joint itself. Because of this all programs and physical therapy to rehabilitate an injured or repaired knee require rebuilding and strengthening your muscles. AFTER THE PROCEDURE  After the procedure, you will be moved to a recovery area until most of the effects of the medication have worn off. Your caregiver will discuss the test results with you.  Only take over-the-counter or prescription medicines for pain, discomfort, or fever as directed by your caregiver. SEEK MEDICAL CARE IF:   You have increased bleeding from your wounds.  You see redness, swelling, or have increasing pain in your wounds.  You have pus coming from your wound.  You have an oral temperature above 102 F (38.9  C).  You notice a bad smell coming from the wound or dressing.  You have severe pain with any motion of your knee. SEEK IMMEDIATE MEDICAL CARE IF:   You develop a rash.  You have difficulty breathing.  You have any allergic problems. Document Released: 06/12/2000 Document Revised: 09/07/2011 Document Reviewed: 01/04/2008 [facility] Patient Information 2014 [place], [place]. Wear and Tear Disorders of the Knee (Arthritis, Osteoarthritis) Everyone will experience wear and tear injuries (arthritis, osteoarthritis) of the knee. These are the changes we all get as we age. They come from the joint stress of daily living. The amount of cartilage damage in your knee and your symptoms determine if you need surgery. Mild problems require approximately two months recovery time. More severe problems take several months to recover. With mild problems, your surgeon may find worn and rough cartilage surfaces. With severe changes, your surgeon may find cartilage that has completely worn away and exposed the bone. Loose bodies of bone and cartilage, bone spurs (excess bone growth), and injuries to the menisci (cushions between the large bones of your leg) are also common. All of these problems can cause pain. For a mild wear and tear problem, rough cartilage may simply need to be shaved and smoothed. For more severe problems with areas of exposed bone, your surgeon may use an instrument for roughing up the bone surfaces to stimulate new cartilage growth. Loose bodies are usually removed. Torn menisci may be trimmed or repaired. ABOUT THE ARTHROSCOPIC PROCEDURE Arthroscopy is a surgical technique. It allows your orthopedic surgeon to diagnose and treat your knee injury with accuracy. The surgeon looks into your knee through a small scope. The scope  is like a small (pencil-sized) telescope. Arthroscopy is less invasive than open knee surgery. You can expect a more rapid recovery. After the procedure, you will be moved  to a recovery area until most of the effects of the medication have worn off. Your caregiver will discuss the test results with you. RECOVERY The severity of the arthritis and the type of procedure performed will determine recovery time. Other important factors include age, physical condition, medical conditions, and the type of rehabilitation program. Strengthening your muscles after arthroscopy helps guarantee a better recovery. Follow your caregiver's instructions. Use crutches, rest, elevate, ice, and do knee exercises as instructed. Your caregivers will help you and instruct you with exercises and other physical therapy required to regain your mobility, muscle strength, and functioning following surgery. Only take over-the-counter or prescription medicines for pain, discomfort, or fever as directed by your caregiver.  SEEK MEDICAL CARE IF:   There is increased bleeding (more than a small spot) from the wound.  You notice redness, swelling, or increasing pain in the wound.  Pus is coming from wound.  You develop an unexplained oral temperature above 102 F (38.9 C) , or as your caregiver suggests.  You notice a foul smell coming from the wound or dressing.  You have severe pain with motion of the knee. SEEK IMMEDIATE MEDICAL CARE IF:   You develop a rash.  You have difficulty breathing.  You have any allergic problems. MAKE SURE YOU:   Understand these instructions.  Will watch your condition.  Will get help right away if you are not doing well or get worse. Document Released: 06/12/2000 Document Revised: 09/07/2011 Document Reviewed: 11/09/2007 [facility] Patient Information 2014 [place], [place].

## 2013-02-06 ENCOUNTER — Telehealth: Payer: Self-pay | Admitting: Orthopedic Surgery

## 2013-02-06 NOTE — Telephone Encounter (Signed)
Patient called with questions for Dr Romeo Apple regarding knee surgery.  States would like Dr. To return her call. Ph [phone]

## 2013-02-07 ENCOUNTER — Other Ambulatory Visit: Payer: Self-pay | Admitting: *Deleted

## 2013-02-07 ENCOUNTER — Telehealth: Payer: Self-pay | Admitting: Orthopedic Surgery

## 2013-02-07 NOTE — Telephone Encounter (Signed)
*   Updated - per call back from patient - her questions for Dr Romeo Apple are regarding how long on crutches, how long unable to drive, mobility, following arthroscopic knee surgery, as she states she is looking for work, and needs to have some of this information.  Her ph# is [phone].

## 2013-02-07 NOTE — Telephone Encounter (Signed)
Contacted insurer, [employer] at [phone], reached representative, Ronaldo Miyamoto, who transferred me to the provider network pre-certification medical provider,"[employer]" - left voice message for return call, regarding out-patient surgery for 03/03/13, CPT 29881, 29880, ICD-9 codes 719.46, 715.96, 836.0.  (Follow up in 24-48 hrs if no response)

## 2013-02-09 ENCOUNTER — Encounter (HOSPITAL_COMMUNITY): Payer: Self-pay | Admitting: Pharmacy Technician

## 2013-02-09 NOTE — Telephone Encounter (Signed)
Received reply from "[employer]" - pt's insurer [employer]'s pre-cert contact party, states their "pre-cert request form" must be completed and faxed. Done, and faxed to [phone] (ph [phone]), regarding surgery noted. *Fol/Up if no response by 02/20/13

## 2013-02-13 ENCOUNTER — Telehealth: Payer: Self-pay | Admitting: Orthopedic Surgery

## 2013-02-13 NOTE — Telephone Encounter (Signed)
Christie Manning called today and wants to cancel her surgery that is scheduled for 03/03/13. Does not want to reschedule it now.

## 2013-02-13 NOTE — Telephone Encounter (Signed)
Ok   Send these to [employer]

## 2013-02-13 NOTE — Telephone Encounter (Signed)
This was also routed to [employer]

## 2013-02-13 NOTE — Telephone Encounter (Signed)
Per Revonda Standard at [place], patient's insurer's 3rd party pre-authorization contact, patient is approved for 9/5/154 out-patient surgery noted,  With Pre-cert # [id number].

## 2013-02-15 NOTE — Telephone Encounter (Signed)
Cancelled surgery.

## 2013-02-24 ENCOUNTER — Inpatient Hospital Stay (HOSPITAL_COMMUNITY): Admission: RE | Admit: 2013-02-24 | Payer: PRIVATE HEALTH INSURANCE | Source: Ambulatory Visit

## 2013-03-03 ENCOUNTER — Ambulatory Visit: Admit: 2013-03-03 | Payer: Self-pay | Admitting: Orthopedic Surgery

## 2013-03-03 SURGERY — ARTHROSCOPY, KNEE
Anesthesia: General | Laterality: Right

## 2013-04-20 ENCOUNTER — Encounter: Payer: Self-pay | Admitting: Orthopedic Surgery

## 2013-04-20 ENCOUNTER — Ambulatory Visit: Payer: PRIVATE HEALTH INSURANCE | Admitting: Orthopedic Surgery

## 2013-04-27 ENCOUNTER — Other Ambulatory Visit: Payer: Self-pay

## 2013-04-27 DIAGNOSIS — Z1231 Encounter for screening mammogram for malignant neoplasm of breast: Secondary | ICD-10-CM

## 2013-06-19 ENCOUNTER — Ambulatory Visit: Payer: PRIVATE HEALTH INSURANCE

## 2013-06-23 ENCOUNTER — Ambulatory Visit: Payer: PRIVATE HEALTH INSURANCE

## 2013-06-28 ENCOUNTER — Ambulatory Visit
Admission: RE | Admit: 2013-06-28 | Discharge: 2013-06-28 | Disposition: A | Discharge status: Home / Self Care | Payer: PRIVATE HEALTH INSURANCE | Source: Ambulatory Visit

## 2013-06-28 DIAGNOSIS — Z1231 Encounter for screening mammogram for malignant neoplasm of breast: Secondary | ICD-10-CM

## 2013-08-02 ENCOUNTER — Other Ambulatory Visit: Payer: Self-pay | Admitting: Gastroenterology

## 2013-08-02 DIAGNOSIS — R1310 Dysphagia, unspecified: Secondary | ICD-10-CM

## 2013-08-11 ENCOUNTER — Ambulatory Visit
Admission: RE | Admit: 2013-08-11 | Discharge: 2013-08-11 | Disposition: A | Discharge status: Home / Self Care | Payer: PRIVATE HEALTH INSURANCE | Source: Ambulatory Visit | Attending: Gastroenterology | Admitting: Gastroenterology

## 2013-08-11 DIAGNOSIS — R1310 Dysphagia, unspecified: Secondary | ICD-10-CM

## 2013-09-06 ENCOUNTER — Telehealth: Payer: Self-pay | Admitting: Orthopedic Surgery

## 2013-09-06 NOTE — Telephone Encounter (Signed)
Patient called to request an immediate appointment for right knee injection (last seen in office 01/19/13, and notes from August of 2014 indicate she had cancelled the knee surgery.)   Advised that Dr Aline Brochure is in surgery, then will be out of office for the next several days; offered the first available appointment 09/21/13.  She states she will hold and not schedule this for now, as she may go on to Urgent care in [place].

## 2013-09-21 ENCOUNTER — Ambulatory Visit: Payer: PRIVATE HEALTH INSURANCE | Admitting: Orthopedic Surgery

## 2013-10-19 ENCOUNTER — Ambulatory Visit: Payer: PRIVATE HEALTH INSURANCE | Admitting: Orthopedic Surgery

## 2014-02-05 ENCOUNTER — Other Ambulatory Visit: Payer: Self-pay | Admitting: Gastroenterology

## 2014-02-05 DIAGNOSIS — R112 Nausea with vomiting, unspecified: Secondary | ICD-10-CM

## 2014-02-09 ENCOUNTER — Inpatient Hospital Stay: Admission: RE | Admit: 2014-02-09 | Payer: PRIVATE HEALTH INSURANCE | Source: Ambulatory Visit

## 2014-02-12 ENCOUNTER — Ambulatory Visit: Payer: PRIVATE HEALTH INSURANCE | Admitting: Gastroenterology

## 2014-02-14 ENCOUNTER — Ambulatory Visit
Admission: RE | Admit: 2014-02-14 | Discharge: 2014-02-14 | Disposition: A | Discharge status: Home / Self Care | Payer: PRIVATE HEALTH INSURANCE | Source: Ambulatory Visit | Attending: Gastroenterology | Admitting: Gastroenterology

## 2014-02-14 DIAGNOSIS — R112 Nausea with vomiting, unspecified: Secondary | ICD-10-CM

## 2014-03-28 ENCOUNTER — Ambulatory Visit (INDEPENDENT_AMBULATORY_CARE_PROVIDER_SITE_OTHER): Payer: PRIVATE HEALTH INSURANCE | Admitting: Orthopedic Surgery

## 2014-03-28 ENCOUNTER — Encounter (INDEPENDENT_AMBULATORY_CARE_PROVIDER_SITE_OTHER): Payer: Self-pay

## 2014-03-28 VITALS — BP 127/79 | Ht 64.0 in | Wt 259.0 lb

## 2014-03-28 DIAGNOSIS — M2140 Flat foot [pes planus] (acquired), unspecified foot: Secondary | ICD-10-CM | POA: Insufficient documentation

## 2014-03-28 DIAGNOSIS — M25561 Pain in right knee: Secondary | ICD-10-CM | POA: Insufficient documentation

## 2014-03-28 DIAGNOSIS — M1710 Unilateral primary osteoarthritis, unspecified knee: Secondary | ICD-10-CM

## 2014-03-28 DIAGNOSIS — M1711 Unilateral primary osteoarthritis, right knee: Secondary | ICD-10-CM

## 2014-03-28 DIAGNOSIS — M25569 Pain in unspecified knee: Secondary | ICD-10-CM

## 2014-03-28 DIAGNOSIS — M216X9 Other acquired deformities of unspecified foot: Secondary | ICD-10-CM

## 2014-03-28 DIAGNOSIS — M2141 Flat foot [pes planus] (acquired), right foot: Secondary | ICD-10-CM

## 2014-03-28 NOTE — Patient Instructions (Signed)
Goal weight 235

## 2014-03-28 NOTE — Progress Notes (Signed)
  Established patient worsening right knee problem  This is a 53 year old female presents with long history of right knee pain over the lateral joint line which has been previously worked up and found to have osteoarthritis without meniscal tear we discussed possible arthroscopic surgery because she is so young to try to buy her some time. Her pain has gotten worse  Review of systems she does not have any other musculoskeletal back pain but she does have some pain in her right foot and ankle on the lateral and medial sides with progressive foot deformity. Denies numbness and tingling unsteady gait denies easy bruising or bleeding denies chest pain  History Past Medical History  Diagnosis Date  . PONV (postoperative nausea and vomiting)   . Vertigo     occasional  . GERD (gastroesophageal reflux disease)     no current meds.  . Difficulty waking     from anesthesia  . Nipple discharge in female 08/2012    right  . Elevated cholesterol     no current meds.    BP 127/79  Ht 5\' 4"  (1.626 m)  Wt 259 lb (117.482 kg)  BMI 44.44 kg/m2 She has lost about 6 or 8 pounds she is exercising we discussed the need to get her BMI under 40 to proceed with knee replacement surgery which we've decided to go ahead and do in January of next year provided her weight has decreased  She is well-groomed. She is oriented to person place and time her mood is normal affect is normal her gait is unsupported she has no limp  Has bilateral flexible pes planus with medial and lateral ankle joint tenderness and weakness on heel rise.  Right knee lateral joint line tenderness flexion 130 stability normal strength normal skin normal pulses normal temperature normal lymph nodes negative sensation normal reflexes normal balance normal  The most recent x-rays show osteoarthritis  She decided to go ahead and proceed with knee replacement surgery once we get her BMI down to 40  Also recommend orthotics for pes planus  and foot flat deformity.

## 2014-04-30 ENCOUNTER — Encounter: Payer: Self-pay | Admitting: Orthopedic Surgery

## 2014-06-11 ENCOUNTER — Other Ambulatory Visit: Payer: Self-pay

## 2014-06-11 DIAGNOSIS — Z1231 Encounter for screening mammogram for malignant neoplasm of breast: Secondary | ICD-10-CM

## 2014-06-19 ENCOUNTER — Ambulatory Visit: Payer: PRIVATE HEALTH INSURANCE | Admitting: Orthopedic Surgery

## 2014-06-28 ENCOUNTER — Ambulatory Visit: Payer: PRIVATE HEALTH INSURANCE

## 2014-07-02 ENCOUNTER — Ambulatory Visit
Admission: RE | Admit: 2014-07-02 | Discharge: 2014-07-02 | Disposition: A | Discharge status: Home / Self Care | Payer: PRIVATE HEALTH INSURANCE | Source: Ambulatory Visit

## 2014-07-02 ENCOUNTER — Encounter (INDEPENDENT_AMBULATORY_CARE_PROVIDER_SITE_OTHER): Payer: Self-pay

## 2014-07-02 DIAGNOSIS — Z1231 Encounter for screening mammogram for malignant neoplasm of breast: Secondary | ICD-10-CM

## 2014-10-04 ENCOUNTER — Telehealth: Payer: Self-pay | Admitting: Orthopedic Surgery

## 2014-10-04 NOTE — Telephone Encounter (Addendum)
Patient is requesting refill on Ibuprofen 800 mg her Rx has expired, she has been riding her exercise bike and she is having some pain, please advise?

## 2014-10-05 NOTE — Telephone Encounter (Signed)
Routing to Dr Aline Brochure  I do not see Ibuprofen 800 in chart history

## 2014-10-06 NOTE — Telephone Encounter (Signed)
YES

## 2014-10-08 ENCOUNTER — Other Ambulatory Visit: Payer: Self-pay | Admitting: *Deleted

## 2014-10-08 MED ORDER — IBUPROFEN 800 MG PO TABS: 800.0000 mg | ORAL_TABLET | Freq: Three times a day (TID) | ORAL | Status: DC | PRN

## 2014-10-08 NOTE — Telephone Encounter (Signed)
done

## 2015-06-14 ENCOUNTER — Other Ambulatory Visit: Payer: Self-pay

## 2015-06-14 DIAGNOSIS — Z1231 Encounter for screening mammogram for malignant neoplasm of breast: Secondary | ICD-10-CM

## 2015-07-22 ENCOUNTER — Ambulatory Visit: Payer: PRIVATE HEALTH INSURANCE

## 2015-07-31 ENCOUNTER — Ambulatory Visit: Payer: PRIVATE HEALTH INSURANCE

## 2015-08-15 ENCOUNTER — Ambulatory Visit (HOSPITAL_COMMUNITY): Payer: PRIVATE HEALTH INSURANCE

## 2015-08-19 ENCOUNTER — Ambulatory Visit: Payer: PRIVATE HEALTH INSURANCE

## 2015-08-21 ENCOUNTER — Ambulatory Visit (HOSPITAL_COMMUNITY): Payer: PRIVATE HEALTH INSURANCE

## 2015-08-30 ENCOUNTER — Ambulatory Visit (HOSPITAL_COMMUNITY): Payer: PRIVATE HEALTH INSURANCE

## 2015-09-04 ENCOUNTER — Ambulatory Visit (HOSPITAL_COMMUNITY)
Admission: RE | Admit: 2015-09-04 | Discharge: 2015-09-04 | Disposition: A | Discharge status: Home / Self Care | Payer: PRIVATE HEALTH INSURANCE | Source: Ambulatory Visit | Attending: Family Medicine | Admitting: Family Medicine

## 2015-09-04 ENCOUNTER — Ambulatory Visit (HOSPITAL_COMMUNITY): Payer: PRIVATE HEALTH INSURANCE

## 2015-09-04 DIAGNOSIS — Z1231 Encounter for screening mammogram for malignant neoplasm of breast: Secondary | ICD-10-CM | POA: Diagnosis not present

## 2016-04-08 ENCOUNTER — Ambulatory Visit (INDEPENDENT_AMBULATORY_CARE_PROVIDER_SITE_OTHER): Payer: PRIVATE HEALTH INSURANCE | Admitting: Orthopaedic Surgery

## 2016-04-08 DIAGNOSIS — M1711 Unilateral primary osteoarthritis, right knee: Secondary | ICD-10-CM | POA: Diagnosis not present

## 2016-04-15 ENCOUNTER — Ambulatory Visit (INDEPENDENT_AMBULATORY_CARE_PROVIDER_SITE_OTHER): Payer: PRIVATE HEALTH INSURANCE | Admitting: Orthopaedic Surgery

## 2016-04-15 DIAGNOSIS — M1711 Unilateral primary osteoarthritis, right knee: Secondary | ICD-10-CM

## 2016-05-06 ENCOUNTER — Encounter (INDEPENDENT_AMBULATORY_CARE_PROVIDER_SITE_OTHER): Payer: Self-pay | Admitting: Orthopaedic Surgery

## 2016-05-06 ENCOUNTER — Ambulatory Visit (INDEPENDENT_AMBULATORY_CARE_PROVIDER_SITE_OTHER): Payer: PRIVATE HEALTH INSURANCE | Admitting: Orthopaedic Surgery

## 2016-05-06 VITALS — BP 123/69 | HR 76 | Ht 64.0 in | Wt 254.0 lb

## 2016-05-06 DIAGNOSIS — M1711 Unilateral primary osteoarthritis, right knee: Secondary | ICD-10-CM | POA: Diagnosis not present

## 2016-05-06 MED ORDER — SODIUM HYALURONATE (VISCOSUP) 20 MG/2ML IX SOSY
20.0000 mg | PREFILLED_SYRINGE | INTRA_ARTICULAR | Status: AC | PRN
  Administered 2016-05-06: 20 mg via INTRA_ARTICULAR

## 2016-05-06 NOTE — Progress Notes (Signed)
Office Visit Note   Patient: Christie Manning           Date of Birth: 1960/09/11           MRN: [id number] Visit Date: 05/06/2016              Requested by: Sharilyn Sites, MD [address] [place], [place] [address] PCP: Purvis Kilts, MD   Assessment & Plan: Visit Diagnoses: No diagnosis found.  Plan: follow up as needed  Follow-Up Instructions: No Follow-up on file.   Orders:  No orders of the defined types were placed in this encounter.  No orders of the defined types were placed in this encounter.     Procedures: Large Joint Inj Date/Time: 05/06/2016 4:19 PM Performed by: Garald Balding Authorized by: Garald Balding   Consent Given by:  Patient Timeout: prior to procedure the correct patient, procedure, and site was verified   Indications:  Pain and joint swelling Location:  Knee Site:  R knee Prep: patient was prepped and draped in usual sterile fashion   Needle Size:  25 G Needle Length:  1.5 inches Approach:  Anteromedial Ultrasound Guidance: No   Fluoroscopic Guidance: No   Arthrogram: No Medications:  20 mg Sodium Hyaluronate 20 MG/2ML Aspiration Attempted: No   Patient tolerance:  Patient tolerated the procedure well with no immediate complications     Clinical Data: No additional findings.   Subjective: Chief Complaint  Patient presents with  . Right Knee - Injections    Pt here for 3rd Euflexxa injection HX of IBS    Review of Systems  Constitutional: Positive for chills.  Eyes: Negative.   Respiratory: Negative.   Cardiovascular: Negative.   Gastrointestinal: Negative.   Musculoskeletal: Negative.   Skin: Negative.   Allergic/Immunologic: Negative.   Neurological: Negative.   Hematological: Negative.   Psychiatric/Behavioral: Negative.      Objective: Vital Signs: There were no vitals taken for this visit.  Physical Exam  Ortho Exam right knee was not hot red or ef fused so I proceeded with the  third and final viscose supplementation injection  Specialty Comments:  No specialty comments available.  Imaging: No results found.   PMFS History: Patient Active Problem List   Diagnosis Date Noted  . Pes planovalgus, acquired 03/28/2014  . Primary osteoarthritis of right knee 03/28/2014  . Knee pain, right 03/28/2014  . Arthritis of knee, degenerative 01/19/2013  . Knee pain 01/19/2013  . Acute meniscal tear, medial 11/29/2012  . Obesity 02/01/2012  . H/O: hysterectomy 02/01/2012  . Ovarian cyst   . CLOSED FRACTURE OF ONE OR MORE PHALANGES OF FOOT 11/05/2009  . 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 KNEE PAIN 10/17/2008  . ANSERINE BURSITIS 10/17/2008   Past Medical History:  Diagnosis Date  . Difficulty waking    from anesthesia  . Elevated cholesterol    no current meds.  Marland Kitchen GERD (gastroesophageal reflux disease)    no current meds.  . Nipple discharge in female 08/2012   right  . PONV (postoperative nausea and vomiting)   . Vertigo    occasional    Family History  Problem Relation Age of Onset  . Diabetes Mother   . Diabetes Sister   . Hypertension Sister   . Hypertension Brother   . Cancer Maternal Grandmother   . Hypertension Brother   . Anesthesia problems Son     wakes up very hyper    Past Surgical History:  Procedure Laterality Date  .  ABDOMINAL HYSTERECTOMY  age 84   complete  . BREAST DUCTAL SYSTEM EXCISION Right 09/28/2012   Procedure: RIGHT BREAST CENTRAL DUCTAL EXCISION  ;  Surgeon: Joyice Faster. Cornett, MD;  Location: [facility];  Service: General;  Laterality: Right;  . CARPAL TUNNEL RELEASE Bilateral 2004  . CHOLECYSTECTOMY    . ESOPHAGOGASTRODUODENOSCOPY (EGD) WITH ESOPHAGEAL DILATION  04/04/2003; 09/13/2001  . TUBAL LIGATION     Social History   Occupational History  . Not on file.   Social History Main Topics  . Smoking status: Never Smoker  . Smokeless tobacco: Never Used  . Alcohol use No  . Drug use: No  . Sexual  activity: Yes    Birth control/ protection: Surgical

## 2016-07-29 ENCOUNTER — Other Ambulatory Visit: Payer: Self-pay | Admitting: Obstetrics and Gynecology

## 2016-07-29 DIAGNOSIS — Z1231 Encounter for screening mammogram for malignant neoplasm of breast: Secondary | ICD-10-CM

## 2016-09-09 ENCOUNTER — Ambulatory Visit: Payer: PRIVATE HEALTH INSURANCE

## 2016-09-29 ENCOUNTER — Ambulatory Visit
Admission: RE | Admit: 2016-09-29 | Discharge: 2016-09-29 | Disposition: A | Discharge status: Home / Self Care | Payer: PRIVATE HEALTH INSURANCE | Source: Ambulatory Visit | Attending: Obstetrics and Gynecology | Admitting: Obstetrics and Gynecology

## 2016-09-29 DIAGNOSIS — Z1231 Encounter for screening mammogram for malignant neoplasm of breast: Secondary | ICD-10-CM

## 2016-10-13 ENCOUNTER — Ambulatory Visit (INDEPENDENT_AMBULATORY_CARE_PROVIDER_SITE_OTHER): Payer: PRIVATE HEALTH INSURANCE | Admitting: Orthopaedic Surgery

## 2016-10-13 ENCOUNTER — Ambulatory Visit (INDEPENDENT_AMBULATORY_CARE_PROVIDER_SITE_OTHER): Payer: PRIVATE HEALTH INSURANCE

## 2016-10-13 VITALS — BP 123/65 | HR 85 | Resp 14 | Ht 64.5 in | Wt 260.0 lb

## 2016-10-13 DIAGNOSIS — M542 Cervicalgia: Secondary | ICD-10-CM

## 2016-10-13 DIAGNOSIS — M25512 Pain in left shoulder: Secondary | ICD-10-CM

## 2016-10-13 NOTE — Progress Notes (Signed)
Office Visit Note   Patient: Christie Manning           Date of Birth: Mar 01, 1961           MRN: [id number] Visit Date: 10/13/2016              Requested by: Sharilyn Sites, MD [address] [place], [place] [address] PCP: Purvis Kilts, MD   Assessment & Plan: Visit Diagnoses:  1. Neck pain, bilateral   2. Acute pain of left shoulder   X-rays of cervical spine and left shoulder are negative. Her pain is predominantly related to the left shoulder with painful overhead motion. Hopefully this will resolve over period of weeks using a sling and over-the-counter medicine. If not I might consider an MRI scan of the left shoulder. I do not see any evidence of fracture or subluxation. The mechanism of injury is unlikely to create a rotator cuff tear.   Follow-Up Instructions: Return in about 2 weeks (around 10/27/2016).   Orders:  Orders Placed This Encounter  Procedures  . XR Cervical Spine 2 or 3 views  . XR Shoulder Left   No orders of the defined types were placed in this encounter.     Procedures: No procedures performed   Clinical Data: No additional findings.   Subjective: Chief Complaint  Patient presents with  . Neck - Pain, Injury    Pt fell over a box and landed with her left shoulder against a cement wall x 4 days ago  . Left Shoulder - Pain, Injury  [name] tripped and landed against a wall at home last Thursday. The blow was directly on the left shoulder. She notes that the skin was a little "red" but never turn black and blue. Since that time she's had progressive difficulty raising her left arm over her head. She is also a little bit of "neck pain. Pain seems to be rather diffusely is situated between the base of her cervical spine to the left and as far distally as her left elbow she had some tingling initially but notes that she's not had any trouble over the last 2-3 days. Denies any headaches. He has been using over-the-counter medicines  which seemed to make a difference.  HPI  Review of Systems   Objective: Vital Signs: BP 123/65   Pulse 85   Resp 14   Ht 5' 4.5" (1.638 m)   Wt 260 lb (117.9 kg)   BMI 43.94 kg/m   Physical Exam  Ortho Examcervical spine range of motion with minimal discomfort. No significant loss of motion. No referred pain to the interscapular regional left shoulder. Skin intact. No pain at the acromioclavicular joint. Positive impingement. Painful overhead movement both actively and passively without crepitation. Biceps appears to be intact. I could not place her arm above her head more than about 20 related to her pain. No elbow discomfort. No wrist or hand discomfort good grip and release. Neurovascular exam intact.  Specialty Comments:  No specialty comments available.  Imaging: Xr Cervical Spine 2 Or 3 Views  Result Date: 10/13/2016 2 views of the cervical spine were obtained in the AP and lateral projections. The lateral view notes normal alignment of the cervical spine without evidence of listhesis. Disc spaces are well maintained. His ectopic calcification just posterior spinous process of C7 but patient is asymptomatic in that area  Xr Shoulder Left  Result Date: 10/13/2016 Films of the left shoulder obtained in several projections. There is a  normal space between the humeral head and the chromium without ectopic calcification. No evidence of a fracture. The humeral head is centered in the glenoid. Mild degenerative changes at the acromioclavicular joint. No elevation of the clavicle in relation to the acromion.    PMFS History: Patient Active Problem List   Diagnosis Date Noted  . Pes planovalgus, acquired 03/28/2014  . Primary osteoarthritis of right knee 03/28/2014  . Knee pain, right 03/28/2014  . Arthritis of knee, degenerative 01/19/2013  . Knee pain 01/19/2013  . Acute meniscal tear, medial 11/29/2012  . Obesity 02/01/2012  . H/O: hysterectomy 02/01/2012  . Ovarian cyst    . CLOSED FRACTURE OF ONE OR MORE PHALANGES OF FOOT 11/05/2009  . 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 KNEE PAIN 10/17/2008  . ANSERINE BURSITIS 10/17/2008   Past Medical History:  Diagnosis Date  . Difficulty waking    from anesthesia  . Elevated cholesterol    no current meds.  Marland Kitchen GERD (gastroesophageal reflux disease)    no current meds.  . Nipple discharge in female 08/2012   right  . PONV (postoperative nausea and vomiting)   . Vertigo    occasional    Family History  Problem Relation Age of Onset  . Diabetes Mother   . Diabetes Sister   . Hypertension Sister   . Hypertension Brother   . Hypertension Brother   . Cancer Maternal Grandmother   . Anesthesia problems Son     wakes up very hyper  . Breast cancer Cousin     Past Surgical History:  Procedure Laterality Date  . ABDOMINAL HYSTERECTOMY  age 9   complete  . BREAST DUCTAL SYSTEM EXCISION Right 09/28/2012   Procedure: RIGHT BREAST CENTRAL DUCTAL EXCISION  ;  Surgeon: Joyice Faster. Cornett, MD;  Location: [facility];  Service: General;  Laterality: Right;  . CARPAL TUNNEL RELEASE Bilateral 2004  . CHOLECYSTECTOMY    . ESOPHAGOGASTRODUODENOSCOPY (EGD) WITH ESOPHAGEAL DILATION  04/04/2003; 09/13/2001  . TUBAL LIGATION     Social History   Occupational History  . Not on file.   Social History Main Topics  . Smoking status: Never Smoker  . Smokeless tobacco: Never Used  . Alcohol use No  . Drug use: No  . Sexual activity: Yes    Birth control/ protection: Surgical

## 2016-10-21 ENCOUNTER — Ambulatory Visit (INDEPENDENT_AMBULATORY_CARE_PROVIDER_SITE_OTHER): Payer: PRIVATE HEALTH INSURANCE | Admitting: Orthopaedic Surgery

## 2016-10-28 ENCOUNTER — Ambulatory Visit (INDEPENDENT_AMBULATORY_CARE_PROVIDER_SITE_OTHER): Payer: PRIVATE HEALTH INSURANCE | Admitting: Orthopaedic Surgery

## 2016-10-28 DIAGNOSIS — M25512 Pain in left shoulder: Secondary | ICD-10-CM

## 2016-10-28 MED ORDER — BUPIVACAINE HCL 0.5 % IJ SOLN
2.0000 mL | INTRAMUSCULAR | Status: AC | PRN
  Administered 2016-10-28: 2 mL via INTRA_ARTICULAR

## 2016-10-28 MED ORDER — METHYLPREDNISOLONE ACETATE 40 MG/ML IJ SUSP
80.0000 mg | INTRAMUSCULAR | Status: AC | PRN
  Administered 2016-10-28: 80 mg

## 2016-10-28 MED ORDER — LIDOCAINE HCL 1 % IJ SOLN
2.0000 mL | INTRAMUSCULAR | Status: AC | PRN
  Administered 2016-10-28: 2 mL

## 2016-10-28 NOTE — Progress Notes (Signed)
Office Visit Note   Patient: Christie Manning           Date of Birth: 03-27-1961           MRN: [id number] Visit Date: 10/28/2016              Requested by: Sharilyn Sites, MD [address] [place], [place] [address] PCP: Purvis Kilts, MD   Assessment & Plan: Visit Diagnoses:  1. Acute pain of left shoulder   Left shoulder pain after a recent fall. Has positive impingement and empty can testing and early adhesive capsulitis.  Plan: Subacromial and intra-articular cortisone injection left shoulder. Monitor her response. If no improvement over the next 3-4 weeks consider an MRI scan  Follow-Up Instructions: Return if symptoms worsen or fail to improve.   Orders:  No orders of the defined types were placed in this encounter.  No orders of the defined types were placed in this encounter.     Procedures: Large Joint Inj Date/Time: 10/28/2016 12:46 PM Performed by: Garald Balding Authorized by: Garald Balding   Consent Given by:  Patient Timeout: prior to procedure the correct patient, procedure, and site was verified   Indications:  Pain Location:  Shoulder Site:  L glenohumeral Prep: patient was prepped and draped in usual sterile fashion   Needle Size:  25 G Needle Length:  1.5 inches Approach:  Posterior Ultrasound Guidance: No   Fluoroscopic Guidance: No   Arthrogram: No   Medications:  2 mL lidocaine 1 %; 2 mL bupivacaine 0.5 %; 80 mg methylPREDNISolone acetate 40 MG/ML Aspiration Attempted: No   Patient tolerance:  Patient tolerated the procedure well with no immediate complications     Clinical Data: No additional findings.   Subjective: No chief complaint on file. Mrs. Adamson relates that her left shoulder is still giving her quite a bit of "trouble". She is a little better after the fall and doesn't wear the sling but still having difficulty with raising her arm over her head and scratching her back. Pain is localized along the  shoulder with referred pain to the subacromial region. No numbness or tingling. Denies any neck pain.  HPI  Review of Systems   Objective: Vital Signs: There were no vitals taken for this visit.  Physical Exam  Ortho Exam left shoulder with positive impingement  . Positive empty can testing. Able to raise her arm over her head with some discomfort. Unable to scratch the middle of her back sepsis intact. Skin intact. No pain with range of motion of cervical spine.   Imaging: No results found.   PMFS History: Patient Active Problem List   Diagnosis Date Noted  . Pes planovalgus, acquired 03/28/2014  . Primary osteoarthritis of right knee 03/28/2014  . Knee pain, right 03/28/2014  . Arthritis of knee, degenerative 01/19/2013  . Knee pain 01/19/2013  . Acute meniscal tear, medial 11/29/2012  . Obesity 02/01/2012  . H/O: hysterectomy 02/01/2012  . Ovarian cyst   . CLOSED FRACTURE OF ONE OR MORE PHALANGES OF FOOT 11/05/2009  . 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 KNEE PAIN 10/17/2008  . ANSERINE BURSITIS 10/17/2008   Past Medical History:  Diagnosis Date  . Difficulty waking    from anesthesia  . Elevated cholesterol    no current meds.  Marland Kitchen GERD (gastroesophageal reflux disease)    no current meds.  . Nipple discharge in female 08/2012   right  . PONV (postoperative nausea and vomiting)   .  Vertigo    occasional    Family History  Problem Relation Age of Onset  . Diabetes Mother   . Diabetes Sister   . Hypertension Sister   . Hypertension Brother   . Hypertension Brother   . Cancer Maternal Grandmother   . Anesthesia problems Son     wakes up very hyper  . Breast cancer Cousin     Past Surgical History:  Procedure Laterality Date  . ABDOMINAL HYSTERECTOMY  age 56   complete  . BREAST DUCTAL SYSTEM EXCISION Right 09/28/2012   Procedure: RIGHT BREAST CENTRAL DUCTAL EXCISION  ;  Surgeon: Joyice Faster. Cornett, MD;  Location: [facility];  Service:  General;  Laterality: Right;  . CARPAL TUNNEL RELEASE Bilateral 2004  . CHOLECYSTECTOMY    . ESOPHAGOGASTRODUODENOSCOPY (EGD) WITH ESOPHAGEAL DILATION  04/04/2003; 09/13/2001  . TUBAL LIGATION     Social History   Occupational History  . Not on file.   Social History Main Topics  . Smoking status: Never Smoker  . Smokeless tobacco: Never Used  . Alcohol use No  . Drug use: No  . Sexual activity: Yes    Birth control/ protection: Surgical     Garald Balding, MD   Note - This record has been created using [employer].  Chart creation errors have been sought, but may not always  have been located. Such creation errors do not reflect on  the standard of medical care.

## 2016-11-24 ENCOUNTER — Other Ambulatory Visit: Payer: Self-pay

## 2016-11-24 ENCOUNTER — Encounter (HOSPITAL_COMMUNITY): Payer: Self-pay | Admitting: Emergency Medicine

## 2016-11-24 ENCOUNTER — Emergency Department (HOSPITAL_COMMUNITY)
Admission: EM | Admit: 2016-11-24 | Discharge: 2016-11-25 | Disposition: A | Discharge status: Home / Self Care | Payer: PRIVATE HEALTH INSURANCE | Attending: Emergency Medicine | Admitting: Emergency Medicine

## 2016-11-24 ENCOUNTER — Emergency Department (HOSPITAL_COMMUNITY): Payer: PRIVATE HEALTH INSURANCE

## 2016-11-24 DIAGNOSIS — K2930 Chronic superficial gastritis without bleeding: Secondary | ICD-10-CM | POA: Insufficient documentation

## 2016-11-24 DIAGNOSIS — K2900 Acute gastritis without bleeding: Secondary | ICD-10-CM

## 2016-11-24 DIAGNOSIS — R000 Tachycardia, unspecified: Secondary | ICD-10-CM | POA: Diagnosis present

## 2016-11-24 HISTORY — DX: Irritable bowel syndrome, unspecified: K58.9

## 2016-11-24 LAB — CBC WITH DIFFERENTIAL/PLATELET
BASOS ABS: 0 10*3/uL (ref 0.0–0.1)
Basophils Relative: 0 %
EOS PCT: 2 %
Eosinophils Absolute: 0.1 10*3/uL (ref 0.0–0.7)
HCT: 37.8 % (ref 36.0–46.0)
Hemoglobin: 12.3 g/dL (ref 12.0–15.0)
Lymphocytes Relative: 38 %
Lymphs Abs: 2.8 10*3/uL (ref 0.7–4.0)
MCH: 30.5 pg (ref 26.0–34.0)
MCHC: 32.5 g/dL (ref 30.0–36.0)
MCV: 93.8 fL (ref 78.0–100.0)
MONO ABS: 0.3 10*3/uL (ref 0.1–1.0)
Monocytes Relative: 4 %
Neutro Abs: 4.2 10*3/uL (ref 1.7–7.7)
Neutrophils Relative %: 56 %
PLATELETS: 256 10*3/uL (ref 150–400)
RBC: 4.03 MIL/uL (ref 3.87–5.11)
RDW: 13.2 % (ref 11.5–15.5)
WBC: 7.4 10*3/uL (ref 4.0–10.5)

## 2016-11-24 LAB — BASIC METABOLIC PANEL
Anion gap: 8 (ref 5–15)
BUN: 12 mg/dL (ref 6–20)
CALCIUM: 9.6 mg/dL (ref 8.9–10.3)
CO2: 31 mmol/L (ref 22–32)
Chloride: 103 mmol/L (ref 101–111)
Creatinine, Ser: 0.82 mg/dL (ref 0.44–1.00)
GFR calc Af Amer: >60 mL/min (ref >60)
Glucose, Bld: 113 mg/dL — ABNORMAL HIGH (ref 65–99)
Potassium: 3.8 mmol/L (ref 3.5–5.1)
SODIUM: 142 mmol/L (ref 135–145)

## 2016-11-24 LAB — HEPATIC FUNCTION PANEL
ALT: 21 U/L (ref 14–54)
AST: 20 U/L (ref 15–41)
Albumin: 3.7 g/dL (ref 3.5–5.0)
Alkaline Phosphatase: 94 U/L (ref 38–126)
BILIRUBIN TOTAL: 0.3 mg/dL (ref 0.3–1.2)
Bilirubin, Direct: <0.1 mg/dL — ABNORMAL LOW (ref 0.1–0.5)
TOTAL PROTEIN: 7.4 g/dL (ref 6.5–8.1)

## 2016-11-24 LAB — LIPASE, BLOOD: LIPASE: 29 U/L (ref 11–51)

## 2016-11-24 LAB — TROPONIN I: Troponin I: <0.03 ng/mL (ref <0.03)

## 2016-11-24 MED ORDER — PANTOPRAZOLE SODIUM 40 MG IV SOLR
40.0000 mg | Freq: Once | INTRAVENOUS | Status: AC
  Administered 2016-11-24: 40 mg via INTRAVENOUS
  Filled 2016-11-24: qty 40

## 2016-11-24 MED ORDER — PANTOPRAZOLE SODIUM 20 MG PO TBEC: 20.0000 mg | DELAYED_RELEASE_TABLET | Freq: Two times a day (BID) | ORAL | 0 refills | Status: DC

## 2016-11-24 MED ORDER — ONDANSETRON HCL 4 MG PO TABS: 4.0000 mg | ORAL_TABLET | Freq: Three times a day (TID) | ORAL | 0 refills | Status: DC | PRN

## 2016-11-24 MED ORDER — SUCRALFATE 1 G PO TABS: 1.0000 g | ORAL_TABLET | Freq: Three times a day (TID) | ORAL | 0 refills | Status: AC | PRN

## 2016-11-24 MED ORDER — GI COCKTAIL ~~LOC~~
30.0000 mL | Freq: Once | ORAL | Status: DC
  Filled 2016-11-24: qty 30

## 2016-11-24 MED ORDER — SODIUM CHLORIDE 0.9 % IV BOLUS (SEPSIS)
1000.0000 mL | Freq: Once | INTRAVENOUS | Status: AC
  Administered 2016-11-24: 1000 mL via INTRAVENOUS

## 2016-11-24 NOTE — ED Notes (Signed)
Pt reports being under an increased amount of stress and eating sausage which caused some stomach pain. Denies CP or SOB at this time .

## 2016-11-24 NOTE — ED Notes (Signed)
Pt took 81mg  asa pta

## 2016-11-24 NOTE — ED Triage Notes (Signed)
Pt states is stressed out and felt her heart beating fast. Denies cp/tightness/pressure. nad at this time. Hr 116 in triage

## 2016-11-24 NOTE — ED Provider Notes (Signed)
[facility] DEPT Provider Note   CSN: [id number] Arrival date & time: 11/24/16  1823  By signing my name below, I, Margit Banda, attest that this documentation has been prepared under the direction and in the presence of Julianne Rice, MD. Electronically Signed: Margit Banda, ED Scribe. 11/24/16. 9:11 PM.  History   Chief Complaint Chief Complaint  Patient presents with  . Tachycardia    HPI Christie Manning is a 56 y.o. female who presents to the Emergency Department complaining of a sudden increased heart rate that started today after eating salsa. Pt is not sure if it is related to her being stressed out. She also reports her abdomen feels bloated and she has been feeling a burning sensation. Pain radiates to her back. She also notes occasionally getting a metallic taste in the back of her mouth. Drinks orange juice in the morning. Pt denies CP, chest tightness, chest pressure, blood in stool, nausea, and vomiting.  The history is provided by the patient. No language interpreter was used.    Past Medical History:  Diagnosis Date  . Difficulty waking    from anesthesia  . Elevated cholesterol    no current meds.  Marland Kitchen GERD (gastroesophageal reflux disease)    no current meds.  . IBS (irritable bowel syndrome)   . Nipple discharge in female 08/2012   right  . PONV (postoperative nausea and vomiting)   . Vertigo    occasional    Patient Active Problem List   Diagnosis Date Noted  . Pes planovalgus, acquired 03/28/2014  . Primary osteoarthritis of right knee 03/28/2014  . Knee pain, right 03/28/2014  . Arthritis of knee, degenerative 01/19/2013  . Knee pain 01/19/2013  . Acute meniscal tear, medial 11/29/2012  . Obesity 02/01/2012  . H/O: hysterectomy 02/01/2012  . Ovarian cyst   . CLOSED FRACTURE OF ONE OR MORE PHALANGES OF FOOT 11/05/2009  . 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 KNEE PAIN 10/17/2008  . ANSERINE BURSITIS 10/17/2008    Past Surgical  History:  Procedure Laterality Date  . ABDOMINAL HYSTERECTOMY  age 89   complete  . BREAST DUCTAL SYSTEM EXCISION Right 09/28/2012   Procedure: RIGHT BREAST CENTRAL DUCTAL EXCISION  ;  Surgeon: Joyice Faster. Cornett, MD;  Location: [facility];  Service: General;  Laterality: Right;  . CARPAL TUNNEL RELEASE Bilateral 2004  . CHOLECYSTECTOMY    . ESOPHAGOGASTRODUODENOSCOPY (EGD) WITH ESOPHAGEAL DILATION  04/04/2003; 09/13/2001  . TUBAL LIGATION      OB History    Gravida Para Term Preterm AB Living   2 2           SAB TAB Ectopic Multiple Live Births                   Home Medications    Prior to Admission medications   Medication Sig Start Date End Date Taking? Authorizing Provider  Coenzyme Q10 (CO Q 10 PO) Take 1 capsule by mouth daily.   Yes [provider]  ondansetron (ZOFRAN) 4 MG tablet Take 1 tablet (4 mg total) by mouth every 8 (eight) hours as needed for nausea or vomiting. 11/24/16   Julianne Rice, MD  pantoprazole (PROTONIX) 20 MG tablet Take 1 tablet (20 mg total) by mouth 2 (two) times daily. 11/24/16   Julianne Rice, MD  sucralfate (CARAFATE) 1 g tablet Take 1 tablet (1 g total) by mouth 3 (three) times daily as needed. 11/24/16   Julianne Rice, MD    Family  History Family History  Problem Relation Age of Onset  . Diabetes Mother   . Diabetes Sister   . Hypertension Sister   . Hypertension Brother   . Hypertension Brother   . Cancer Maternal Grandmother   . Anesthesia problems Son        wakes up very hyper  . Breast cancer Cousin     Social History Social History  Substance Use Topics  . Smoking status: Never Smoker  . Smokeless tobacco: Never Used  . Alcohol use No     Allergies   Cefdinir and Aspirin   Review of Systems Review of Systems  Constitutional: Negative for chills, fatigue and fever.  HENT: Negative for congestion, facial swelling and sore throat.   Eyes: Negative for visual disturbance.  Respiratory:  Negative for cough, chest tightness and shortness of breath.   Cardiovascular: Positive for palpitations. Negative for chest pain and leg swelling.  Gastrointestinal: Positive for abdominal pain and nausea. Negative for blood in stool, constipation, diarrhea and vomiting.  Genitourinary: Negative for dysuria, flank pain and frequency.  Musculoskeletal: Negative for back pain, myalgias, neck pain and neck stiffness.  Neurological: Negative for dizziness, weakness, light-headedness, numbness and headaches.  All other systems reviewed and are negative.    Physical Exam Updated Vital Signs BP (!) 129/53   Pulse 98   Temp 98 F (36.7 C) (Oral)   Resp 15   Ht 5\' 4"  (1.626 m)   Wt 115.2 kg (254 lb)   SpO2 97%   BMI 43.60 kg/m   Physical Exam  Constitutional: She is oriented to person, place, and time. She appears well-developed and well-nourished. No distress.  HENT:  Head: Normocephalic and atraumatic.  Mouth/Throat: Oropharynx is clear and moist.  Eyes: EOM are normal. Pupils are equal, round, and reactive to light.  Neck: Normal range of motion. Neck supple.  Cardiovascular: Normal rate, regular rhythm, normal heart sounds and intact distal pulses.  Exam reveals no gallop and no friction rub.   No murmur heard. Pulmonary/Chest: Effort normal and breath sounds normal. No respiratory distress. She has no wheezes. She has no rales. She exhibits no tenderness.  Abdominal: Soft. Bowel sounds are normal. There is tenderness (epigastric tenderness with palpation.). There is no rebound and no guarding.  Musculoskeletal: Normal range of motion. She exhibits no edema or tenderness.  No lower extremity swelling, asymmetry or tenderness. No midline thoracic or lumbar tenderness. No CVA tenderness.  Neurological: She is alert and oriented to person, place, and time.  Skin: Skin is warm and dry. No rash noted. She is not diaphoretic. No erythema.  Psychiatric: She has a normal mood and affect.  Her behavior is normal.  Nursing note and vitals reviewed.    ED Treatments / Results  DIAGNOSTIC STUDIES: Oxygen Saturation is 100% on RA, normal by my interpretation.   COORDINATION OF CARE: 9:11 PM-Discussed next steps with pt which include being treated for gastritis. Pt verbalized understanding and is agreeable with the plan.    Labs (all labs ordered are listed, but only abnormal results are displayed) Labs Reviewed  BASIC METABOLIC PANEL - Abnormal; Notable for the following:       Result Value   Glucose, Bld 113 (*)    All other components within normal limits  HEPATIC FUNCTION PANEL - Abnormal; Notable for the following:    Bilirubin, Direct <0.1 (*)    All other components within normal limits  CBC WITH DIFFERENTIAL/PLATELET  TROPONIN I  LIPASE, BLOOD  EKG  EKG Interpretation  Date/Time:  Tuesday Nov 24 2016 18:37:31 EDT Ventricular Rate:  128 PR Interval:  158 QRS Duration: 74 QT Interval:  302 QTC Calculation: 440 R Axis:   -10 Text Interpretation:  Sinus tachycardia Possible Anterior infarct , age undetermined Left ventricular hypertrophy Abnormal ECG Confirmed by Wilson Singer  MD, Annie Main [phone]) on 11/24/2016 8:07:16 PM Also confirmed by Lita Mains  MD, Alenna Russell ([id number])  on 11/24/2016 8:46:51 PM       Radiology No results found.  Procedures Procedures (including critical care time)  Medications Ordered in ED Medications  sodium chloride 0.9 % bolus 1,000 mL (0 mLs Intravenous Stopped 11/24/16 2220)  pantoprazole (PROTONIX) injection 40 mg (40 mg Intravenous Given 11/24/16 2131)     Initial Impression / Assessment and Plan / ED Course  I have reviewed the triage vital signs and the nursing notes.  Pertinent labs & imaging results that were available during my care of the patient were reviewed by me and considered in my medical decision making (see chart for details).     Patient states she is feeling better after medications and IV fluids. Tachycardia  has resolved. Suspect some degree of gastritis which may be contributing to her symptoms. Will start on PPI and return precautions given.  Final Clinical Impressions(s) / ED Diagnoses   Final diagnoses:  Acute superficial gastritis without hemorrhage  Sinus tachycardia    New Prescriptions Discharge Medication List as of 11/24/2016 11:36 PM    START taking these medications   Details  ondansetron (ZOFRAN) 4 MG tablet Take 1 tablet (4 mg total) by mouth every 8 (eight) hours as needed for nausea or vomiting., Starting Tue 11/24/2016, Print    pantoprazole (PROTONIX) 20 MG tablet Take 1 tablet (20 mg total) by mouth 2 (two) times daily., Starting Tue 11/24/2016, Print    sucralfate (CARAFATE) 1 g tablet Take 1 tablet (1 g total) by mouth 3 (three) times daily as needed., Starting Tue 11/24/2016, Print       I personally performed the services described in this documentation, which was scribed in my presence. The recorded information has been reviewed and is accurate.       Julianne Rice, MD 11/27/16 [phone]

## 2016-11-25 NOTE — ED Notes (Signed)
Pt alert & oriented x4, stable gait. Patient given discharge instructions, paperwork & prescription(s). Patient  instructed to stop at the registration desk to finish any additional paperwork. Patient verbalized understanding. Pt left department w/ no further questions. 

## 2016-12-04 ENCOUNTER — Other Ambulatory Visit: Payer: Self-pay | Admitting: Gastroenterology

## 2016-12-04 DIAGNOSIS — R1013 Epigastric pain: Secondary | ICD-10-CM

## 2016-12-06 ENCOUNTER — Emergency Department (HOSPITAL_COMMUNITY)
Admission: EM | Admit: 2016-12-06 | Discharge: 2016-12-06 | Disposition: A | Discharge status: Home / Self Care | Payer: PRIVATE HEALTH INSURANCE | Attending: Emergency Medicine | Admitting: Emergency Medicine

## 2016-12-06 ENCOUNTER — Encounter (HOSPITAL_COMMUNITY): Payer: Self-pay | Admitting: *Deleted

## 2016-12-06 DIAGNOSIS — R1013 Epigastric pain: Secondary | ICD-10-CM | POA: Diagnosis not present

## 2016-12-06 DIAGNOSIS — R0789 Other chest pain: Secondary | ICD-10-CM | POA: Insufficient documentation

## 2016-12-06 DIAGNOSIS — Z79899 Other long term (current) drug therapy: Secondary | ICD-10-CM | POA: Diagnosis not present

## 2016-12-06 LAB — CBC WITH DIFFERENTIAL/PLATELET
BASOS ABS: 0 10*3/uL (ref 0.0–0.1)
Basophils Relative: 1 %
EOS PCT: 1 %
Eosinophils Absolute: 0.1 10*3/uL (ref 0.0–0.7)
HEMATOCRIT: 37.7 % (ref 36.0–46.0)
Hemoglobin: 12.2 g/dL (ref 12.0–15.0)
LYMPHS ABS: 3 10*3/uL (ref 0.7–4.0)
LYMPHS PCT: 43 %
MCH: 30 pg (ref 26.0–34.0)
MCHC: 32.4 g/dL (ref 30.0–36.0)
MCV: 92.6 fL (ref 78.0–100.0)
MONO ABS: 0.5 10*3/uL (ref 0.1–1.0)
Monocytes Relative: 7 %
NEUTROS ABS: 3.3 10*3/uL (ref 1.7–7.7)
Neutrophils Relative %: 48 %
PLATELETS: 253 10*3/uL (ref 150–400)
RBC: 4.07 MIL/uL (ref 3.87–5.11)
RDW: 13 % (ref 11.5–15.5)
WBC: 6.9 10*3/uL (ref 4.0–10.5)

## 2016-12-06 LAB — COMPREHENSIVE METABOLIC PANEL
ALT: 18 U/L (ref 14–54)
AST: 16 U/L (ref 15–41)
Albumin: 3.8 g/dL (ref 3.5–5.0)
Alkaline Phosphatase: 94 U/L (ref 38–126)
Anion gap: 11 (ref 5–15)
BUN: 10 mg/dL (ref 6–20)
CHLORIDE: 101 mmol/L (ref 101–111)
CO2: 29 mmol/L (ref 22–32)
CREATININE: 0.82 mg/dL (ref 0.44–1.00)
Calcium: 9.8 mg/dL (ref 8.9–10.3)
GFR calc Af Amer: >60 mL/min (ref >60)
Glucose, Bld: 114 mg/dL — ABNORMAL HIGH (ref 65–99)
POTASSIUM: 4 mmol/L (ref 3.5–5.1)
SODIUM: 141 mmol/L (ref 135–145)
Total Bilirubin: 0.6 mg/dL (ref 0.3–1.2)
Total Protein: 7.6 g/dL (ref 6.5–8.1)

## 2016-12-06 LAB — LIPASE, BLOOD: LIPASE: 25 U/L (ref 11–51)

## 2016-12-06 LAB — I-STAT TROPONIN, ED: Troponin i, poc: 0 ng/mL (ref 0.00–0.08)

## 2016-12-06 MED ORDER — GI COCKTAIL ~~LOC~~
30.0000 mL | Freq: Once | ORAL | Status: DC
  Filled 2016-12-06: qty 30

## 2016-12-06 MED ORDER — FAMOTIDINE 20 MG PO TABS: 20.0000 mg | ORAL_TABLET | Freq: Two times a day (BID) | ORAL | 0 refills | Status: AC

## 2016-12-06 MED ORDER — FAMOTIDINE 20 MG PO TABS
20.0000 mg | ORAL_TABLET | Freq: Once | ORAL | Status: AC
  Administered 2016-12-06: 20 mg via ORAL
  Filled 2016-12-06: qty 1

## 2016-12-06 NOTE — ED Triage Notes (Signed)
Pt states she having rapid heart beat again & chest tightness.

## 2016-12-06 NOTE — ED Notes (Signed)
Pt alert & oriented x4, stable gait. Patient given discharge instructions, paperwork & prescription(s). Patient  instructed to stop at the registration desk to finish any additional paperwork. Patient verbalized understanding. Pt left department w/ no further questions. 

## 2016-12-06 NOTE — ED Provider Notes (Signed)
[facility] DEPT Provider Note   CSN: [id number] Arrival date & time: 12/06/16  0011     History   Chief Complaint Chief Complaint  Patient presents with  . Chest Pain    HPI Christie Manning is a 56 y.o. female.  The history is provided by the patient.  She was at a birthday dinner when she noted a tight feeling in her chest. This lasted a few minutes and resolved but came back. The second episode lasted about 10 minutes before resolving. It resolved after she drank some Sprite. Tightness has not recurred since then. It was 6:30 PM when the tightness resolved. She has also been having burning in her upper abdomen for the last 2 days. She had been seen in the ED about 10 days ago with similar complaints and had been started on excellent by her PCP. She states that it gives her diarrhea. Burning in her abdomen did improve for a period of time, but then started coming back 2 days ago. Her chest discomfort was not affected by body position, deep breathing, movement. Nothing seems to affect the abdominal pain either. She is a nonsmoker and denies history of diabetes or hypertension or hyperlipidemia. There is no family history of premature coronary artery atherosclerosis.   Past Medical History:  Diagnosis Date  . Difficulty waking    from anesthesia  . Elevated cholesterol    no current meds.  Marland Kitchen GERD (gastroesophageal reflux disease)    no current meds.  . IBS (irritable bowel syndrome)   . Nipple discharge in female 08/2012   right  . PONV (postoperative nausea and vomiting)   . Vertigo    occasional    Patient Active Problem List   Diagnosis Date Noted  . Pes planovalgus, acquired 03/28/2014  . Primary osteoarthritis of right knee 03/28/2014  . Knee pain, right 03/28/2014  . Arthritis of knee, degenerative 01/19/2013  . Knee pain 01/19/2013  . Acute meniscal tear, medial 11/29/2012  . Obesity 02/01/2012  . H/O: hysterectomy 02/01/2012  . Ovarian cyst   . CLOSED  FRACTURE OF ONE OR MORE PHALANGES OF FOOT 11/05/2009  . 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 KNEE PAIN 10/17/2008  . ANSERINE BURSITIS 10/17/2008    Past Surgical History:  Procedure Laterality Date  . ABDOMINAL HYSTERECTOMY  age 53   complete  . BREAST DUCTAL SYSTEM EXCISION Right 09/28/2012   Procedure: RIGHT BREAST CENTRAL DUCTAL EXCISION  ;  Surgeon: Joyice Faster. Cornett, MD;  Location: [facility];  Service: General;  Laterality: Right;  . CARPAL TUNNEL RELEASE Bilateral 2004  . CHOLECYSTECTOMY    . ESOPHAGOGASTRODUODENOSCOPY (EGD) WITH ESOPHAGEAL DILATION  04/04/2003; 09/13/2001  . TUBAL LIGATION      OB History    Gravida Para Term Preterm AB Living   2 2           SAB TAB Ectopic Multiple Live Births                   Home Medications    Prior to Admission medications   Medication Sig Start Date End Date Taking? Authorizing Provider  Coenzyme Q10 (CO Q 10 PO) Take 1 capsule by mouth daily.   Yes [provider]  dexlansoprazole (DEXILANT) 60 MG capsule Take 60 mg by mouth daily.   Yes [provider]  ondansetron (ZOFRAN) 4 MG tablet Take 1 tablet (4 mg total) by mouth every 8 (eight) hours as needed for nausea or vomiting. 11/24/16  Yes Julianne Rice, MD  pantoprazole (PROTONIX) 20 MG tablet Take 1 tablet (20 mg total) by mouth 2 (two) times daily. 11/24/16  Yes Julianne Rice, MD  sucralfate (CARAFATE) 1 g tablet Take 1 tablet (1 g total) by mouth 3 (three) times daily as needed. 11/24/16  Yes Julianne Rice, MD    Family History Family History  Problem Relation Age of Onset  . Diabetes Mother   . Diabetes Sister   . Hypertension Sister   . Hypertension Brother   . Hypertension Brother   . Cancer Maternal Grandmother   . Anesthesia problems Son        wakes up very hyper  . Breast cancer Cousin     Social History Social History  Substance Use Topics  . Smoking status: Never Smoker  . Smokeless tobacco: Never Used  .  Alcohol use No     Allergies   Cefdinir and Aspirin   Review of Systems Review of Systems  All other systems reviewed and are negative.    Physical Exam Updated Vital Signs BP (!) 122/59 (BP Location: Left Arm)   Pulse 96   Temp 98.3 F (36.8 C) (Oral)   Resp 16   Ht 5\' 4"  (1.626 m)   Wt 115.2 kg (254 lb)   SpO2 97%   BMI 43.60 kg/m   Physical Exam  Nursing note and vitals reviewed.  56 year old female, resting comfortably and in no acute distress. Vital signs are normal. Oxygen saturation is 97%, which is normal. Head is normocephalic and atraumatic. PERRLA, EOMI. Oropharynx is clear. Neck is nontender and supple without adenopathy or JVD. Back is nontender and there is no CVA tenderness. Lungs are clear without rales, wheezes, or rhonchi. Chest is nontender. Heart has regular rate and rhythm without murmur. Abdomen is soft, flat, nontender without masses or hepatosplenomegaly and peristalsis is normoactive. Extremities have no cyanosis or edema, full range of motion is present. Skin is warm and dry without rash. Neurologic: Mental status is normal, cranial nerves are intact, there are no motor or sensory deficits.  ED Treatments / Results  Labs (all labs ordered are listed, but only abnormal results are displayed) Labs Reviewed - No data to display  EKG  EKG Interpretation  Date/Time:  Sunday December 06 2016 00:28:42 EDT Ventricular Rate:  93 PR Interval:    QRS Duration: 87 QT Interval:  358 QTC Calculation: 446 R Axis:   12 Text Interpretation:  Sinus rhythm Low voltage, precordial leads Abnormal R-wave progression, early transition When compared with ECG of 11/24/2016, No significant change was found Confirmed by Delora Fuel ([id number]) on 12/06/2016 12:37:51 AM       Radiology No results found.  Procedures Procedures (including critical care time)  Medications Ordered in ED Medications  gi cocktail (Maalox,Lidocaine,Donnatal) (30 mLs Oral Refused  12/06/16 0051)  famotidine (PEPCID) tablet 20 mg (20 mg Oral Given 12/06/16 0112)     Initial Impression / Assessment and Plan / ED Course  I have reviewed the triage vital signs and the nursing notes.  Pertinent labs & imaging results that were available during my care of the patient were reviewed by me and considered in my medical decision making (see chart for details).  Chest pain which is somewhat atypical. This is most likely from GI origin. Heard symptoms which are no longer responding to dexlansoprazole. She is having significant diarrhea with that, so I do not believe she would tolerate a higher dose. Old records are reviewed  confirming recent ED visit for gastritis. ECG is normal. Heart Score = 1, which puts her at a very low risk for major adverse cardiac events in the next 30 days. She'll be given GI cocktail, and anticipate sending her home with H2 blocker to supplement her proton pump inhibitor.  ED workup is unremarkable including a troponin of 0. Since this was drawn more than 6 hours after her pain had resolved, there is no need for a repeat troponin. She feels much better after GI cocktail and oral famotidine. She is discharged with prescription for famotidine. She does have a CT of abdomen and pelvis ordered for June 12, and she is to keep that appointment. She will need to continue following up with her gastroenterologist regarding her epigastric pain, but also requested that she follow-up with her primary care provider regarding her episode of chest pain..  Final Clinical Impressions(s) / ED Diagnoses   Final diagnoses:  Epigastric pain  Atypical chest pain    New Prescriptions New Prescriptions   FAMOTIDINE (PEPCID) 20 MG TABLET    Take 1 tablet (20 mg total) by mouth 2 (two) times daily.     Delora Fuel, MD 79/43/27 [phone]

## 2016-12-06 NOTE — Discharge Instructions (Signed)
Go for the CT scan on Tuesday, as scheduled. Follow up with the gastroenterologist regarding your stomach pain. Follow up with your primary care provider regarding your chest pain.

## 2016-12-07 ENCOUNTER — Emergency Department (HOSPITAL_COMMUNITY): Payer: PRIVATE HEALTH INSURANCE

## 2016-12-07 ENCOUNTER — Emergency Department (HOSPITAL_COMMUNITY)
Admission: EM | Admit: 2016-12-07 | Discharge: 2016-12-07 | Disposition: A | Discharge status: Home / Self Care | Payer: PRIVATE HEALTH INSURANCE | Attending: Emergency Medicine | Admitting: Emergency Medicine

## 2016-12-07 ENCOUNTER — Encounter (HOSPITAL_COMMUNITY): Payer: Self-pay | Admitting: *Deleted

## 2016-12-07 DIAGNOSIS — Z79899 Other long term (current) drug therapy: Secondary | ICD-10-CM | POA: Insufficient documentation

## 2016-12-07 DIAGNOSIS — R000 Tachycardia, unspecified: Secondary | ICD-10-CM | POA: Insufficient documentation

## 2016-12-07 LAB — CBC
HEMATOCRIT: 39.2 % (ref 36.0–46.0)
HEMOGLOBIN: 12.8 g/dL (ref 12.0–15.0)
MCH: 30.3 pg (ref 26.0–34.0)
MCHC: 32.7 g/dL (ref 30.0–36.0)
MCV: 92.7 fL (ref 78.0–100.0)
Platelets: 260 10*3/uL (ref 150–400)
RBC: 4.23 MIL/uL (ref 3.87–5.11)
RDW: 13.2 % (ref 11.5–15.5)
WBC: 5.5 10*3/uL (ref 4.0–10.5)

## 2016-12-07 LAB — BASIC METABOLIC PANEL
ANION GAP: 10 (ref 5–15)
BUN: 6 mg/dL (ref 6–20)
CHLORIDE: 100 mmol/L — AB (ref 101–111)
CO2: 29 mmol/L (ref 22–32)
Calcium: 9.7 mg/dL (ref 8.9–10.3)
Creatinine, Ser: 0.68 mg/dL (ref 0.44–1.00)
GFR calc non Af Amer: >60 mL/min (ref >60)
GLUCOSE: 121 mg/dL — AB (ref 65–99)
POTASSIUM: 3.5 mmol/L (ref 3.5–5.1)
Sodium: 139 mmol/L (ref 135–145)

## 2016-12-07 LAB — TSH: TSH: 1.515 u[IU]/mL (ref 0.350–4.500)

## 2016-12-07 LAB — TROPONIN I

## 2016-12-07 MED ORDER — SODIUM CHLORIDE 0.9 % IV BOLUS (SEPSIS)
1000.0000 mL | Freq: Once | INTRAVENOUS | Status: AC
  Administered 2016-12-07: 1000 mL via INTRAVENOUS

## 2016-12-07 MED ORDER — METOPROLOL TARTRATE 25 MG PO TABS: 12.5000 mg | ORAL_TABLET | Freq: Two times a day (BID) | ORAL | 0 refills | Status: AC

## 2016-12-07 MED ORDER — METOPROLOL TARTRATE 5 MG/5ML IV SOLN
2.5000 mg | Freq: Once | INTRAVENOUS | Status: AC
  Administered 2016-12-07: 2.5 mg via INTRAVENOUS
  Filled 2016-12-07: qty 5

## 2016-12-07 NOTE — ED Notes (Signed)
No answer in lobby x 2.

## 2016-12-07 NOTE — ED Triage Notes (Signed)
States she was seen at  [facility] on 5/20 with rapid heart rate and was seen at her Dr. Gabriel Carina today and was given pepcid , dixilant 60mg  and states her heart rate was elevated in the office and was told to come to the ED for further eval.

## 2016-12-07 NOTE — ED Provider Notes (Signed)
[facility] DEPT Provider Note   CSN: [id number] Arrival date & time: 12/07/16  1022     History   Chief Complaint Chief Complaint  Patient presents with  . Tachycardia    HPI Christie Manning is a 56 y.o. female.  Pt presents to the ED today with rapid HR.  The pt said this has been going on and off for about a year.  The pt said she did see [facility] cardiology last year and wore a monitor.  She said the monitor was ok, so she was not put on any meds.  The pt said that her hr goes up even higher if she tries to exercise.  She said it makes her want to pass out.  The pt is almost done with graduate school and wants to be well for graduation.      Past Medical History:  Diagnosis Date  . Difficulty waking    from anesthesia  . Elevated cholesterol    no current meds.  Marland Kitchen GERD (gastroesophageal reflux disease)    no current meds.  . IBS (irritable bowel syndrome)   . Nipple discharge in female 08/2012   right  . PONV (postoperative nausea and vomiting)   . Vertigo    occasional    Patient Active Problem List   Diagnosis Date Noted  . Pes planovalgus, acquired 03/28/2014  . Primary osteoarthritis of right knee 03/28/2014  . Knee pain, right 03/28/2014  . Arthritis of knee, degenerative 01/19/2013  . Knee pain 01/19/2013  . Acute meniscal tear, medial 11/29/2012  . Obesity 02/01/2012  . H/O: hysterectomy 02/01/2012  . Ovarian cyst   . CLOSED FRACTURE OF ONE OR MORE PHALANGES OF FOOT 11/05/2009  . 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 KNEE PAIN 10/17/2008  . ANSERINE BURSITIS 10/17/2008    Past Surgical History:  Procedure Laterality Date  . ABDOMINAL HYSTERECTOMY  age 16   complete  . BREAST DUCTAL SYSTEM EXCISION Right 09/28/2012   Procedure: RIGHT BREAST CENTRAL DUCTAL EXCISION  ;  Surgeon: Joyice Faster. Cornett, MD;  Location: [facility];  Service: General;  Laterality: Right;  . CARPAL TUNNEL RELEASE Bilateral 2004  . CHOLECYSTECTOMY    .  ESOPHAGOGASTRODUODENOSCOPY (EGD) WITH ESOPHAGEAL DILATION  04/04/2003; 09/13/2001  . TUBAL LIGATION      OB History    Gravida Para Term Preterm AB Living   2 2           SAB TAB Ectopic Multiple Live Births                   Home Medications    Prior to Admission medications   Medication Sig Start Date End Date Taking? Authorizing Provider  Coenzyme Q10 (CO Q 10 PO) Take 1 capsule by mouth daily.    [provider]  dexlansoprazole (DEXILANT) 60 MG capsule Take 60 mg by mouth daily.    [provider]  famotidine (PEPCID) 20 MG tablet Take 1 tablet (20 mg total) by mouth 2 (two) times daily. 12/31/86   Delora Fuel, MD  metoprolol tartrate (LOPRESSOR) 25 MG tablet Take 0.5 tablets (12.5 mg total) by mouth 2 (two) times daily. 12/07/16   Isla Pence, MD  ondansetron (ZOFRAN) 4 MG tablet Take 1 tablet (4 mg total) by mouth every 8 (eight) hours as needed for nausea or vomiting. 11/24/16   Julianne Rice, MD  pantoprazole (PROTONIX) 20 MG tablet Take 1 tablet (20 mg total) by mouth 2 (two) times daily. 11/24/16  Julianne Rice, MD  sucralfate (CARAFATE) 1 g tablet Take 1 tablet (1 g total) by mouth 3 (three) times daily as needed. 11/24/16   Julianne Rice, MD    Family History Family History  Problem Relation Age of Onset  . Diabetes Mother   . Diabetes Sister   . Hypertension Sister   . Hypertension Brother   . Hypertension Brother   . Cancer Maternal Grandmother   . Anesthesia problems Son        wakes up very hyper  . Breast cancer Cousin     Social History Social History  Substance Use Topics  . Smoking status: Never Smoker  . Smokeless tobacco: Never Used  . Alcohol use No     Allergies   Cefdinir and Aspirin   Review of Systems Review of Systems  Cardiovascular: Positive for palpitations.  All other systems reviewed and are negative.    Physical Exam Updated Vital Signs BP (!) 143/71 (BP Location: Right Arm)   Pulse 92   Temp  98 F (36.7 C) (Oral)   Resp 15   Ht 5\' 4"  (1.626 m)   Wt 115.2 kg (254 lb)   SpO2 100%   BMI 43.60 kg/m   Physical Exam  Constitutional: She is oriented to person, place, and time. She appears well-developed and well-nourished.  HENT:  Head: Normocephalic and atraumatic.  Right Ear: External ear normal.  Left Ear: External ear normal.  Nose: Nose normal.  Mouth/Throat: Oropharynx is clear and moist.  Eyes: Conjunctivae and EOM are normal. Pupils are equal, round, and reactive to light.  Neck: Normal range of motion. Neck supple.  Cardiovascular: Regular rhythm, normal heart sounds and intact distal pulses.  Tachycardia present.   Pulmonary/Chest: Effort normal and breath sounds normal.  Abdominal: Soft. Bowel sounds are normal.  Musculoskeletal: Normal range of motion.  Neurological: She is alert and oriented to person, place, and time.  Skin: Skin is warm.  Psychiatric: She has a normal mood and affect. Her behavior is normal. Judgment and thought content normal.  Nursing note and vitals reviewed.    ED Treatments / Results  Labs (all labs ordered are listed, but only abnormal results are displayed) Labs Reviewed  BASIC METABOLIC PANEL - Abnormal; Notable for the following:       Result Value   Chloride 100 (*)    Glucose, Bld 121 (*)    All other components within normal limits  CBC  TROPONIN I  TSH  URINALYSIS, ROUTINE W REFLEX MICROSCOPIC    EKG  EKG Interpretation  Date/Time:  Monday December 07 2016 10:45:07 EDT Ventricular Rate:  83 PR Interval:  180 QRS Duration: 78 QT Interval:  368 QTC Calculation: 432 R Axis:   1 Text Interpretation:  Normal sinus rhythm with sinus arrhythmia Cannot rule out Anterior infarct , age undetermined Abnormal ECG No significant change since last tracing Confirmed by Dorie Rank [phone]) on 12/07/2016 1:09:46 PM       Radiology Dg Chest 2 View  Result Date: 12/07/2016 CLINICAL DATA:  Rapid heart rate. EXAM: CHEST  2 VIEW  COMPARISON:  11/24/2016 FINDINGS: The heart size and mediastinal contours are within normal limits. Both lungs are clear. The visualized skeletal structures are unremarkable. IMPRESSION: No active cardiopulmonary disease. Electronically Signed   By: Kathreen Devoid   On: 12/07/2016 11:26    Procedures Procedures (including critical care time)  Medications Ordered in ED Medications  sodium chloride 0.9 % bolus 1,000 mL (1,000 mLs Intravenous  New Bag/Given 12/07/16 1524)  metoprolol tartrate (LOPRESSOR) injection 2.5 mg (2.5 mg Intravenous Given 12/07/16 1524)     Initial Impression / Assessment and Plan / ED Course  I have reviewed the triage vital signs and the nursing notes.  Pertinent labs & imaging results that were available during my care of the patient were reviewed by me and considered in my medical decision making (see chart for details).    HR down with IVFs and 2.5 mg lopressor IV.  Pt instructed to avoid caffeine and decongestants.  She is encouraged to f/u with cardiology.  Return if worse.  Final Clinical Impressions(s) / ED Diagnoses   Final diagnoses:  Sinus tachycardia    New Prescriptions New Prescriptions   METOPROLOL TARTRATE (LOPRESSOR) 25 MG TABLET    Take 0.5 tablets (12.5 mg total) by mouth 2 (two) times daily.     Isla Pence, MD 12/07/16 [phone]

## 2016-12-07 NOTE — ED Notes (Signed)
Pt returned to room from xray.

## 2016-12-08 ENCOUNTER — Other Ambulatory Visit: Payer: PRIVATE HEALTH INSURANCE

## 2016-12-22 ENCOUNTER — Other Ambulatory Visit: Payer: PRIVATE HEALTH INSURANCE

## 2016-12-23 ENCOUNTER — Other Ambulatory Visit: Payer: PRIVATE HEALTH INSURANCE

## 2016-12-28 ENCOUNTER — Other Ambulatory Visit: Payer: Self-pay | Admitting: Gastroenterology

## 2016-12-28 DIAGNOSIS — R1310 Dysphagia, unspecified: Secondary | ICD-10-CM

## 2017-01-01 ENCOUNTER — Other Ambulatory Visit: Payer: PRIVATE HEALTH INSURANCE

## 2017-03-03 ENCOUNTER — Ambulatory Visit (INDEPENDENT_AMBULATORY_CARE_PROVIDER_SITE_OTHER): Payer: PRIVATE HEALTH INSURANCE | Admitting: Orthopaedic Surgery

## 2017-06-28 ENCOUNTER — Other Ambulatory Visit: Payer: Self-pay | Admitting: Obstetrics and Gynecology

## 2017-06-28 DIAGNOSIS — Z139 Encounter for screening, unspecified: Secondary | ICD-10-CM

## 2017-09-30 ENCOUNTER — Ambulatory Visit: Payer: PRIVATE HEALTH INSURANCE

## 2017-10-06 ENCOUNTER — Ambulatory Visit
Admission: RE | Admit: 2017-10-06 | Discharge: 2017-10-06 | Disposition: A | Discharge status: Home / Self Care | Payer: PRIVATE HEALTH INSURANCE | Source: Ambulatory Visit | Attending: Obstetrics and Gynecology | Admitting: Obstetrics and Gynecology

## 2017-10-06 DIAGNOSIS — Z139 Encounter for screening, unspecified: Secondary | ICD-10-CM

## 2018-02-23 ENCOUNTER — Ambulatory Visit (INDEPENDENT_AMBULATORY_CARE_PROVIDER_SITE_OTHER): Payer: PRIVATE HEALTH INSURANCE | Admitting: Orthopaedic Surgery

## 2018-03-02 ENCOUNTER — Encounter (INDEPENDENT_AMBULATORY_CARE_PROVIDER_SITE_OTHER): Payer: Self-pay | Admitting: Orthopaedic Surgery

## 2018-03-02 ENCOUNTER — Ambulatory Visit (INDEPENDENT_AMBULATORY_CARE_PROVIDER_SITE_OTHER): Payer: PRIVATE HEALTH INSURANCE | Admitting: Orthopaedic Surgery

## 2018-03-02 VITALS — BP 123/75 | HR 86 | Ht 64.0 in | Wt 206.0 lb

## 2018-03-02 DIAGNOSIS — M67432 Ganglion, left wrist: Secondary | ICD-10-CM

## 2018-03-02 NOTE — Progress Notes (Signed)
Office Visit Note   Patient: Christie Manning           Date of Birth: 09-13-1960           MRN: [id number] Visit Date: 03/02/2018              Requested by: Christie Manning, Christie Manning, [place] [id number] PCP: Christie Manning   Assessment & Plan: Visit Diagnoses:  1. Ganglion cyst of dorsum of left wrist     Plan: Small recurrent ganglion cyst dorsum left wrist.  Asymptomatic at this point.  Would suggest monitoring and consider aspiration or excision should become large and symptomatic  Follow-Up Instructions: Return if symptoms worsen or fail to improve.   Orders:  No orders of the defined types were placed in this encounter.  No orders of the defined types were placed in this encounter.     Procedures: No procedures performed   Clinical Data: No additional findings.   Subjective: Chief Complaint  Patient presents with  . New Patient (Initial Visit)    L WRIST CYST FOR 4 MO, NOT PAINFUL. HAD PRIOR SURGERY IN 2000  Christie Manning relates having had excision of a dorsal left wrist ganglion approximately 20 years ago.  In the past 4 months she is noted some recurrence of the small cyst in the middle of her old incision.  She is not having any pain.  She has not lost any motion.  She denies any numbness or tingling.  HPI  Review of Systems  Constitutional: Negative for fatigue and fever.  HENT: Negative for ear pain.   Eyes: Negative for pain.  Respiratory: Negative for cough and shortness of breath.   Cardiovascular: Negative for leg swelling.  Gastrointestinal: Negative for constipation and diarrhea.  Genitourinary: Negative for difficulty urinating.  Musculoskeletal: Negative for back pain and neck pain.  Skin: Negative for rash.  Allergic/Immunologic: Negative for food allergies.  Neurological: Negative for weakness and numbness.  Hematological: Does not bruise/bleed easily.  Psychiatric/Behavioral: Negative for sleep disturbance.      Objective: Vital Signs: BP 123/75 (BP Location: Left Arm, Patient Position: Sitting, Cuff Size: Normal)   Pulse 86   Ht 5\' 4"  (1.626 m)   Wt 206 lb (93.4 kg)   BMI 35.36 kg/m   Physical Exam  Constitutional: She is oriented to person, place, and time. She appears well-developed and well-nourished.  HENT:  Mouth/Throat: Oropharynx is clear and moist.  Eyes: Pupils are equal, round, and reactive to light. EOM are normal.  Pulmonary/Chest: Effort normal.  Neurological: She is alert and oriented to person, place, and time.  Skin: Skin is warm and dry.  Psychiatric: She has a normal mood and affect. Her behavior is normal.    Ortho Exam left wrist with about a 8 to 9 mm ganglion cyst in the mid dorsum.  Old transverse incision.  The mass is localized in the middle of the old incision.  No redness.  No pain to palpation.  No loss of motion.  Extensor tendon function intact.  Neurovascular exam intact no wrist pain.  No pain volar aspect of her wrist  Specialty Comments:  No specialty comments available.  Imaging: No results found.   PMFS History: Patient Active Problem List   Diagnosis Date Noted  . Ganglion cyst of dorsum of left wrist 03/02/2018  . Pes planovalgus, acquired 03/28/2014  . Primary osteoarthritis of right knee 03/28/2014  . Knee pain, right 03/28/2014  . Arthritis of  knee, degenerative 01/19/2013  . Knee pain 01/19/2013  . Acute meniscal tear, medial 11/29/2012  . Obesity 02/01/2012  . H/O: hysterectomy 02/01/2012  . Ovarian cyst   . CLOSED FRACTURE OF ONE OR MORE PHALANGES OF FOOT 11/05/2009  . 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 KNEE PAIN 10/17/2008  . ANSERINE BURSITIS 10/17/2008   Past Medical History:  Diagnosis Date  . Difficulty waking    from anesthesia  . Elevated cholesterol    no current meds.  Marland Kitchen GERD (gastroesophageal reflux disease)    no current meds.  . IBS (irritable bowel syndrome)   . Nipple discharge in female 08/2012    right  . PONV (postoperative nausea and vomiting)   . Vertigo    occasional    Family History  Problem Relation Age of Onset  . Diabetes Mother   . Diabetes Sister   . Hypertension Sister   . Hypertension Brother   . Hypertension Brother   . Cancer Maternal Grandmother   . Anesthesia problems Son        wakes up very hyper  . Breast cancer Cousin     Past Surgical History:  Procedure Laterality Date  . ABDOMINAL HYSTERECTOMY  age 87   complete  . BREAST DUCTAL SYSTEM EXCISION Right 09/28/2012   Procedure: RIGHT BREAST CENTRAL DUCTAL EXCISION  ;  Surgeon: Joyice Faster. Cornett, Manning;  Location: [facility];  Service: General;  Laterality: Right;  . BREAST EXCISIONAL BIOPSY Right   . CARPAL TUNNEL RELEASE Bilateral 2004  . CHOLECYSTECTOMY    . ESOPHAGOGASTRODUODENOSCOPY (EGD) WITH ESOPHAGEAL DILATION  04/04/2003; 09/13/2001  . TUBAL LIGATION     Social History   Occupational History  . Not on file  Tobacco Use  . Smoking status: Never Smoker  . Smokeless tobacco: Never Used  Substance and Sexual Activity  . Alcohol use: No  . Drug use: No  . Sexual activity: Yes    Birth control/protection: Surgical

## 2018-03-31 ENCOUNTER — Other Ambulatory Visit: Payer: Self-pay | Admitting: Gastroenterology

## 2018-03-31 DIAGNOSIS — R1033 Periumbilical pain: Secondary | ICD-10-CM

## 2018-04-08 ENCOUNTER — Other Ambulatory Visit: Payer: PRIVATE HEALTH INSURANCE

## 2018-05-02 ENCOUNTER — Ambulatory Visit (HOSPITAL_COMMUNITY)
Admission: RE | Admit: 2018-05-02 | Discharge: 2018-05-02 | Disposition: A | Discharge status: Home / Self Care | Payer: PRIVATE HEALTH INSURANCE | Source: Ambulatory Visit | Attending: Family Medicine | Admitting: Family Medicine

## 2018-05-02 ENCOUNTER — Other Ambulatory Visit (HOSPITAL_COMMUNITY): Payer: Self-pay | Admitting: Family Medicine

## 2018-05-02 DIAGNOSIS — R05 Cough: Secondary | ICD-10-CM | POA: Insufficient documentation

## 2018-05-02 DIAGNOSIS — R059 Cough, unspecified: Secondary | ICD-10-CM

## 2018-05-13 ENCOUNTER — Telehealth (INDEPENDENT_AMBULATORY_CARE_PROVIDER_SITE_OTHER): Payer: Self-pay | Admitting: Orthopaedic Surgery

## 2018-05-13 NOTE — Telephone Encounter (Signed)
Opened in error

## 2018-05-16 ENCOUNTER — Ambulatory Visit (INDEPENDENT_AMBULATORY_CARE_PROVIDER_SITE_OTHER): Payer: PRIVATE HEALTH INSURANCE | Admitting: Orthopaedic Surgery

## 2018-05-16 ENCOUNTER — Ambulatory Visit (INDEPENDENT_AMBULATORY_CARE_PROVIDER_SITE_OTHER): Payer: PRIVATE HEALTH INSURANCE

## 2018-05-16 ENCOUNTER — Encounter (INDEPENDENT_AMBULATORY_CARE_PROVIDER_SITE_OTHER): Payer: Self-pay | Admitting: Orthopaedic Surgery

## 2018-05-16 VITALS — BP 130/76 | HR 81 | Ht 64.5 in | Wt 211.0 lb

## 2018-05-16 DIAGNOSIS — M25562 Pain in left knee: Secondary | ICD-10-CM | POA: Diagnosis not present

## 2018-05-16 MED ORDER — IBUPROFEN 800 MG PO TABS: ORAL_TABLET | ORAL | 1 refills | Status: AC

## 2018-05-16 NOTE — Progress Notes (Signed)
Office Visit Note   Patient: Christie Manning           Date of Birth: June 03, 1961           MRN: [id number] Visit Date: 05/16/2018              Requested by: Sharilyn Sites, [place], [place] [address] PCP: Sharilyn Sites, MD   Assessment & Plan: Visit Diagnoses:  1. Acute pain of left knee     Plan: Christie Manning relates that she injured her left knee helping to lift her mother about 4 to 5 days ago.  She felt a pop in her left knee and is now having some trouble anteriorly and laterally.  No skin changes or effusion.  Films are consistent with osteoarthritis.  I believe she is probably aggravated her arthritis.  We will try ibuprofen for the next several weeks and if no improvement consider cortisone injection  Follow-Up Instructions: No follow-ups on file.   Orders:  Orders Placed This Encounter  Procedures  . XR KNEE 3 VIEW LEFT   No orders of the defined types were placed in this encounter.     Procedures: No procedures performed   Clinical Data: No additional findings.   Subjective: Chief Complaint  Patient presents with  . New Patient (Initial Visit)    L KNEE PAIN INJURED MOVING MOM IN BED 6 DAYS AGO.  HAS PAIN AND SWELLING NO NUMBNESS  Christie Manning is 57 years old and visited the office for evaluation of acute onset of left knee pain.  She was helping to lift her mother with a history of dementia last Wednesday i.e. 5 days ago when she twisted her left knee.  Since that time she has had pain with weightbearing predominant along the medial compartment.  She is not had any skin changes.  She is not aware that her knee has been swelling.  No back pain.  No thigh pain.  No distal numbness or tingling  HPI  Review of Systems  Constitutional: Positive for fatigue. Negative for fever.  HENT: Negative for ear pain.   Eyes: Negative for pain.  Respiratory: Negative for cough and shortness of breath.   Cardiovascular: Positive for leg  swelling.  Gastrointestinal: Positive for constipation. Negative for diarrhea.  Genitourinary: Negative for difficulty urinating.  Musculoskeletal: Negative for back pain and neck pain.  Skin: Negative for rash.  Allergic/Immunologic: Negative for food allergies.  Neurological: Positive for weakness. Negative for numbness.  Hematological: Bruises/bleeds easily.  Psychiatric/Behavioral: Positive for sleep disturbance.     Objective: Vital Signs: BP 130/76 (BP Location: Right Arm, Patient Position: Sitting, Cuff Size: Normal)   Pulse 81   Ht 5' 4.5" (1.638 m)   Wt 211 lb (95.7 kg)   BMI 35.66 kg/m   Physical Exam  Constitutional: She is oriented to person, place, and time. She appears well-developed and well-nourished.  HENT:  Mouth/Throat: Oropharynx is clear and moist.  Eyes: Pupils are equal, round, and reactive to light. EOM are normal.  Pulmonary/Chest: Effort normal.  Neurological: She is alert and oriented to person, place, and time.  Skin: Skin is warm and dry.  Psychiatric: She has a normal mood and affect. Her behavior is normal.    Ortho Exam awake alert and oriented x3.  Comfortable sitting.  Straight leg raise negative.  No pain with range of motion of her left hip.  No effusion left knee.  Left knee was not hot or red.  Full extension flexion over 100 degrees without instability.  A little bit of patellar crepitation.  No pain laterally.  Some pain diffusely along the medial compartment.  No grating no distal edema.  Neurovascular exam intact.  Does have a limp referable to her left knee  Specialty Comments:  No specialty comments available.  Imaging: No results found.   PMFS History: Patient Active Problem List   Diagnosis Date Noted  . Ganglion cyst of dorsum of left wrist 03/02/2018  . Pes planovalgus, acquired 03/28/2014  . Primary osteoarthritis of right knee 03/28/2014  . Knee pain, right 03/28/2014  . Arthritis of knee, degenerative 01/19/2013  .  Knee pain 01/19/2013  . Acute meniscal tear, medial 11/29/2012  . Obesity 02/01/2012  . H/O: hysterectomy 02/01/2012  . Ovarian cyst   . CLOSED FRACTURE OF ONE OR MORE PHALANGES OF FOOT 11/05/2009  . 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 KNEE PAIN 10/17/2008  . ANSERINE BURSITIS 10/17/2008   Past Medical History:  Diagnosis Date  . Difficulty waking    from anesthesia  . Elevated cholesterol    no current meds.  Marland Kitchen GERD (gastroesophageal reflux disease)    no current meds.  . IBS (irritable bowel syndrome)   . Nipple discharge in female 08/2012   right  . PONV (postoperative nausea and vomiting)   . Vertigo    occasional    Family History  Problem Relation Age of Onset  . Diabetes Mother   . Diabetes Sister   . Hypertension Sister   . Hypertension Brother   . Hypertension Brother   . Cancer Maternal Grandmother   . Anesthesia problems Son        wakes up very hyper  . Breast cancer Cousin     Past Surgical History:  Procedure Laterality Date  . ABDOMINAL HYSTERECTOMY  age 44   complete  . BREAST DUCTAL SYSTEM EXCISION Right 09/28/2012   Procedure: RIGHT BREAST CENTRAL DUCTAL EXCISION  ;  Surgeon: Joyice Faster. Cornett, MD;  Location: [facility];  Service: General;  Laterality: Right;  . BREAST EXCISIONAL BIOPSY Right   . CARPAL TUNNEL RELEASE Bilateral 2004  . CHOLECYSTECTOMY    . ESOPHAGOGASTRODUODENOSCOPY (EGD) WITH ESOPHAGEAL DILATION  04/04/2003; 09/13/2001  . TUBAL LIGATION     Social History   Occupational History  . Not on file  Tobacco Use  . Smoking status: Never Smoker  . Smokeless tobacco: Never Used  Substance and Sexual Activity  . Alcohol use: No  . Drug use: No  . Sexual activity: Yes    Birth control/protection: Surgical

## 2018-05-31 ENCOUNTER — Telehealth (INDEPENDENT_AMBULATORY_CARE_PROVIDER_SITE_OTHER): Payer: Self-pay

## 2018-05-31 NOTE — Telephone Encounter (Signed)
Talked with patient and advised her that [facility] has tried to contact her to get her consent to have Euflexxa injection delivered to our office.  Provided patient with [facility] phone number to call and give her consent.

## 2018-06-06 ENCOUNTER — Telehealth (INDEPENDENT_AMBULATORY_CARE_PROVIDER_SITE_OTHER): Payer: Self-pay

## 2018-06-06 NOTE — Telephone Encounter (Signed)
Talked with Baldo Ash at the [facility] office and advised her that a new authorization is needed for Euflexxa, bilateral knee.  Previous authorization expired on 06/03/2018.  Faxed patient's information to [facility] at [phone].

## 2018-06-23 ENCOUNTER — Ambulatory Visit (INDEPENDENT_AMBULATORY_CARE_PROVIDER_SITE_OTHER): Payer: PRIVATE HEALTH INSURANCE | Admitting: Otolaryngology

## 2018-06-23 DIAGNOSIS — R42 Dizziness and giddiness: Secondary | ICD-10-CM | POA: Diagnosis not present

## 2018-06-23 DIAGNOSIS — H8112 Benign paroxysmal vertigo, left ear: Secondary | ICD-10-CM

## 2018-07-05 ENCOUNTER — Telehealth (INDEPENDENT_AMBULATORY_CARE_PROVIDER_SITE_OTHER): Payer: Self-pay

## 2018-07-05 NOTE — Telephone Encounter (Signed)
Resubmitted VOB for Euflexxa series, bilateral knee.

## 2018-07-06 ENCOUNTER — Telehealth (INDEPENDENT_AMBULATORY_CARE_PROVIDER_SITE_OTHER): Payer: Self-pay

## 2018-07-06 NOTE — Telephone Encounter (Signed)
Talked with Jake Samples at [facility] and transferred Rx for Euflexxa series, bilateral knee. Faxed Rx for Euflexxa series, bilateral knee to [facility] at [phone].

## 2018-07-07 ENCOUNTER — Telehealth (INDEPENDENT_AMBULATORY_CARE_PROVIDER_SITE_OTHER): Payer: Self-pay

## 2018-07-07 NOTE — Telephone Encounter (Signed)
Initiated PA with Ron at [facility] for Euflexxa series, bilateral knee. [place] Pending# Tat Momoli [id number]

## 2018-07-18 ENCOUNTER — Telehealth (INDEPENDENT_AMBULATORY_CARE_PROVIDER_SITE_OTHER): Payer: Self-pay

## 2018-07-18 NOTE — Telephone Encounter (Signed)
Received Denial letter for PA for Euflexxa series, bilateral knee from [facility] due to patient not waiting 6 months to repeat injection. Talked with Margaretha Sheffield A.at [facility] and advised her that patient has never received Euflexxa injection, bilateral knee. Started a new PA for Euflexxa series, bilateral knee with Margaretha Sheffield A. Pending PA# [id number]  Faxed Office notes to [facility] at [phone].

## 2018-07-19 ENCOUNTER — Telehealth (INDEPENDENT_AMBULATORY_CARE_PROVIDER_SITE_OTHER): Payer: Self-pay

## 2018-07-19 NOTE — Telephone Encounter (Signed)
Talked with Bethena Roys at [facility] and advised her that authorization has been approved for Euflexxa series, bilateral knee.  Per Bethena Roys with [facility], patient's consent is needed for delivery of medication.  Stated that they will contact patient.  [place] Approval# [id number] Valid 07/19/2018- 08/08/2018

## 2018-07-20 ENCOUNTER — Telehealth (INDEPENDENT_AMBULATORY_CARE_PROVIDER_SITE_OTHER): Payer: Self-pay | Admitting: Orthopaedic Surgery

## 2018-07-20 NOTE — Telephone Encounter (Signed)
Di Kindle from [facility] left a voicemail requesting a return call to coordinate the delivery of Euflexxa for patient.  Please call [phone]

## 2018-07-21 NOTE — Telephone Encounter (Signed)
The correct phone [phone]

## 2018-07-21 NOTE — Telephone Encounter (Signed)
Talked with Christie Manning with [facility] and was advised that Euflexxa injection would be delivered on Friday, 07/22/2018.

## 2018-07-29 NOTE — Telephone Encounter (Signed)
FYI - we never received this. Is it at the [facility] office maybe? Thank you.

## 2018-07-29 NOTE — Telephone Encounter (Signed)
Wonderful. If she is going to [facility] should get them. Thank you so much!

## 2018-07-29 NOTE — Telephone Encounter (Signed)
I gave it to [place] [facility].  Thank you.

## 2018-07-29 NOTE — Telephone Encounter (Signed)
Yes, we have it.  I gave it to [facility] or does it need to come to your office?

## 2018-08-01 ENCOUNTER — Telehealth (INDEPENDENT_AMBULATORY_CARE_PROVIDER_SITE_OTHER): Payer: Self-pay

## 2018-08-01 NOTE — Telephone Encounter (Signed)
Talked with Baldo Ash at the [place] office and advised her that we received gel injection and to schedule patient an appointment with Dr. Durward Fortes.  Patient is approved for Euflexxa series, bilateral knee. Patient Purchased through [employer] ([facility] PA required [place] Approval# [id number] Valid 07/19/2018- 08/08/2018

## 2018-08-03 ENCOUNTER — Encounter (INDEPENDENT_AMBULATORY_CARE_PROVIDER_SITE_OTHER): Payer: Self-pay | Admitting: Orthopaedic Surgery

## 2018-08-03 ENCOUNTER — Ambulatory Visit (INDEPENDENT_AMBULATORY_CARE_PROVIDER_SITE_OTHER): Payer: PRIVATE HEALTH INSURANCE | Admitting: Orthopaedic Surgery

## 2018-08-03 VITALS — BP 133/83 | HR 82 | Ht 64.5 in | Wt 211.0 lb

## 2018-08-03 DIAGNOSIS — M1711 Unilateral primary osteoarthritis, right knee: Secondary | ICD-10-CM

## 2018-08-03 MED ORDER — LIDOCAINE HCL 1 % IJ SOLN
2.0000 mL | INTRAMUSCULAR | Status: AC | PRN
  Administered 2018-08-03: 2 mL

## 2018-08-03 MED ORDER — SODIUM HYALURONATE (VISCOSUP) 20 MG/2ML IX SOSY
20.0000 mg | PREFILLED_SYRINGE | INTRA_ARTICULAR | Status: AC | PRN
Start: 2018-08-03 — End: 2018-08-03
  Administered 2018-08-03: 20 mg via INTRA_ARTICULAR

## 2018-08-03 NOTE — Progress Notes (Signed)
Office Visit Note   Patient: Christie Manning           Date of Birth: 1960-11-18           MRN: [id number] Visit Date: 08/03/2018              Requested by: Sharilyn Sites, [place], [place] [address] PCP: Sharilyn Sites, MD   Assessment & Plan: Visit Diagnoses:  1. Primary osteoarthritis of right knee     Plan: First Euflexxa injection right knee.  Return weekly for the next 2 weeks to complete the series  Follow-Up Instructions: Return if symptoms worsen or fail to improve.   Orders:  Orders Placed This Encounter  Procedures  . Large Joint Inj: R knee   No orders of the defined types were placed in this encounter.     Procedures: Large Joint Inj: R knee on 08/03/2018 3:02 PM Indications: pain and joint swelling Details: 25 G 1.5 in needle  Arthrogram: No  Medications: 2 mL lidocaine 1 %; 20 mg Sodium Hyaluronate 20 MG/2ML Outcome: tolerated well, no immediate complications Procedure, treatment alternatives, risks and benefits explained, specific risks discussed. Consent was given by the patient. Immediately prior to procedure a time out was called to verify the correct patient, procedure, equipment, support staff and site/side marked as required. Patient was prepped and draped in the usual sterile fashion.       Clinical Data: No additional findings.   Subjective: Chief Complaint  Patient presents with  . Right Knee - Pain    Euflexxa #1  Right Knee  Patient presents for Euflexxa injection #1 in her right knee.  HPI  Review of Systems   Objective: Vital Signs: BP 133/83   Pulse 82   Ht 5' 4.5" (1.638 m)   Wt 211 lb (95.7 kg)   BMI 35.66 kg/m   Physical Exam  Ortho Exam right knee was not hot warm red or swollen.  We will proceed with the injection  Specialty Comments:  No specialty comments available.  Imaging: No results found.   PMFS History: Patient Active Problem List   Diagnosis Date Noted  . Ganglion  cyst of dorsum of left wrist 03/02/2018  . Pes planovalgus, acquired 03/28/2014  . Primary osteoarthritis of right knee 03/28/2014  . Knee pain, right 03/28/2014  . Arthritis of knee, degenerative 01/19/2013  . Knee pain 01/19/2013  . Acute meniscal tear, medial 11/29/2012  . Obesity 02/01/2012  . H/O: hysterectomy 02/01/2012  . Ovarian cyst   . CLOSED FRACTURE OF ONE OR MORE PHALANGES OF FOOT 11/05/2009  . 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 KNEE PAIN 10/17/2008  . ANSERINE BURSITIS 10/17/2008   Past Medical History:  Diagnosis Date  . Difficulty waking    from anesthesia  . Elevated cholesterol    no current meds.  Marland Kitchen GERD (gastroesophageal reflux disease)    no current meds.  . IBS (irritable bowel syndrome)   . Nipple discharge in female 08/2012   right  . PONV (postoperative nausea and vomiting)   . Vertigo    occasional    Family History  Problem Relation Age of Onset  . Diabetes Mother   . Diabetes Sister   . Hypertension Sister   . Hypertension Brother   . Hypertension Brother   . Cancer Maternal Grandmother   . Anesthesia problems Son        wakes up very hyper  . Breast cancer Cousin     Past  Surgical History:  Procedure Laterality Date  . ABDOMINAL HYSTERECTOMY  age 70   complete  . BREAST DUCTAL SYSTEM EXCISION Right 09/28/2012   Procedure: RIGHT BREAST CENTRAL DUCTAL EXCISION  ;  Surgeon: Joyice Faster. Cornett, MD;  Location: [facility];  Service: General;  Laterality: Right;  . BREAST EXCISIONAL BIOPSY Right   . CARPAL TUNNEL RELEASE Bilateral 2004  . CHOLECYSTECTOMY    . ESOPHAGOGASTRODUODENOSCOPY (EGD) WITH ESOPHAGEAL DILATION  04/04/2003; 09/13/2001  . TUBAL LIGATION     Social History   Occupational History  . Not on file  Tobacco Use  . Smoking status: Never Smoker  . Smokeless tobacco: Never Used  Substance and Sexual Activity  . Alcohol use: No  . Drug use: No  . Sexual activity: Yes    Birth control/protection: Surgical

## 2018-08-10 ENCOUNTER — Ambulatory Visit (INDEPENDENT_AMBULATORY_CARE_PROVIDER_SITE_OTHER): Payer: PRIVATE HEALTH INSURANCE | Admitting: Orthopaedic Surgery

## 2018-08-10 ENCOUNTER — Encounter (INDEPENDENT_AMBULATORY_CARE_PROVIDER_SITE_OTHER): Payer: Self-pay | Admitting: Orthopaedic Surgery

## 2018-08-10 VITALS — BP 141/79 | HR 94 | Ht 64.5 in | Wt 211.0 lb

## 2018-08-10 DIAGNOSIS — M1711 Unilateral primary osteoarthritis, right knee: Secondary | ICD-10-CM

## 2018-08-10 MED ORDER — SODIUM HYALURONATE (VISCOSUP) 20 MG/2ML IX SOSY
20.0000 mg | PREFILLED_SYRINGE | INTRA_ARTICULAR | Status: AC | PRN
  Administered 2018-08-10: 20 mg via INTRA_ARTICULAR

## 2018-08-10 MED ORDER — LIDOCAINE HCL 1 % IJ SOLN
2.0000 mL | INTRAMUSCULAR | Status: AC | PRN
  Administered 2018-08-10: 2 mL

## 2018-08-10 NOTE — Progress Notes (Signed)
Office Visit Note   Patient: SHAYLA HEMING           Date of Birth: 10-15-1960           MRN: [id number] Visit Date: 08/10/2018              Requested by: Sharilyn Sites, [place], [place] [address] PCP: Sharilyn Sites, MD   Assessment & Plan: Visit Diagnoses:  1. Primary osteoarthritis of right knee     Plan: Second Euflexxa injection right knee.  No problems with the first injection  Follow-Up Instructions: Return in about 1 week (around 08/17/2018).   Orders:  Orders Placed This Encounter  Procedures  . Large Joint Inj   No orders of the defined types were placed in this encounter.     Procedures: Large Joint Inj: R knee on 08/10/2018 11:27 AM Indications: pain and joint swelling Details: 25 G 1.5 in needle  Arthrogram: No  Medications: 2 mL lidocaine 1 %; 20 mg Sodium Hyaluronate 20 MG/2ML Outcome: tolerated well, no immediate complications Procedure, treatment alternatives, risks and benefits explained, specific risks discussed. Consent was given by the patient. Immediately prior to procedure a time out was called to verify the correct patient, procedure, equipment, support staff and site/side marked as required. Patient was prepped and draped in the usual sterile fashion.       Clinical Data: No additional findings.   Subjective: Chief Complaint  Patient presents with  . Right Knee - Follow-up    #2 right knee euflexxa  Patient comes in today for #2 euflexxa injection of the right knee. Patient started the injections on 08/03/18.  No change in his knee as of today.  HPI  Review of Systems   Objective: Vital Signs: BP (!) 141/79   Pulse 94   Ht 5' 4.5" (1.638 m)   Wt 211 lb (95.7 kg)   BMI 35.66 kg/m   Physical Exam  Ortho Exam large knees.  No obvious effusion.  No significant joint pain.  Walks without a limp  Specialty Comments:  No specialty comments available.  Imaging: No results found.   PMFS  History: Patient Active Problem List   Diagnosis Date Noted  . Ganglion cyst of dorsum of left wrist 03/02/2018  . Pes planovalgus, acquired 03/28/2014  . Primary osteoarthritis of right knee 03/28/2014  . Knee pain, right 03/28/2014  . Arthritis of knee, degenerative 01/19/2013  . Knee pain 01/19/2013  . Acute meniscal tear, medial 11/29/2012  . Obesity 02/01/2012  . H/O: hysterectomy 02/01/2012  . Ovarian cyst   . CLOSED FRACTURE OF ONE OR MORE PHALANGES OF FOOT 11/05/2009  . 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 KNEE PAIN 10/17/2008  . ANSERINE BURSITIS 10/17/2008   Past Medical History:  Diagnosis Date  . Difficulty waking    from anesthesia  . Elevated cholesterol    no current meds.  Marland Kitchen GERD (gastroesophageal reflux disease)    no current meds.  . IBS (irritable bowel syndrome)   . Nipple discharge in female 08/2012   right  . PONV (postoperative nausea and vomiting)   . Vertigo    occasional    Family History  Problem Relation Age of Onset  . Diabetes Mother   . Diabetes Sister   . Hypertension Sister   . Hypertension Brother   . Hypertension Brother   . Cancer Maternal Grandmother   . Anesthesia problems Son        wakes up very hyper  .  Breast cancer Cousin     Past Surgical History:  Procedure Laterality Date  . ABDOMINAL HYSTERECTOMY  age 12   complete  . BREAST DUCTAL SYSTEM EXCISION Right 09/28/2012   Procedure: RIGHT BREAST CENTRAL DUCTAL EXCISION  ;  Surgeon: Joyice Faster. Cornett, MD;  Location: [facility];  Service: General;  Laterality: Right;  . BREAST EXCISIONAL BIOPSY Right   . CARPAL TUNNEL RELEASE Bilateral 2004  . CHOLECYSTECTOMY    . ESOPHAGOGASTRODUODENOSCOPY (EGD) WITH ESOPHAGEAL DILATION  04/04/2003; 09/13/2001  . TUBAL LIGATION     Social History   Occupational History  . Not on file  Tobacco Use  . Smoking status: Never Smoker  . Smokeless tobacco: Never Used  Substance and Sexual Activity  . Alcohol use: No  . Drug  use: No  . Sexual activity: Yes    Birth control/protection: Surgical

## 2018-08-24 ENCOUNTER — Ambulatory Visit (INDEPENDENT_AMBULATORY_CARE_PROVIDER_SITE_OTHER): Payer: PRIVATE HEALTH INSURANCE | Admitting: Orthopaedic Surgery

## 2018-08-24 ENCOUNTER — Encounter (INDEPENDENT_AMBULATORY_CARE_PROVIDER_SITE_OTHER): Payer: Self-pay | Admitting: Orthopaedic Surgery

## 2018-08-24 VITALS — BP 138/90 | HR 88 | Ht 64.0 in | Wt 200.0 lb

## 2018-08-24 DIAGNOSIS — M1711 Unilateral primary osteoarthritis, right knee: Secondary | ICD-10-CM

## 2018-08-24 MED ORDER — SODIUM HYALURONATE (VISCOSUP) 20 MG/2ML IX SOSY
20.0000 mg | PREFILLED_SYRINGE | INTRA_ARTICULAR | Status: AC | PRN
  Administered 2018-08-24: 20 mg via INTRA_ARTICULAR

## 2018-08-24 NOTE — Progress Notes (Signed)
Office Visit Note   Patient: Christie Manning           Date of Birth: [birth year]           MRN: [id number] Visit Date: 08/24/2018              Requested by: Sharilyn Sites, [address], [place] [address] PCP: Sharilyn Sites, MD   Assessment & Plan: Visit Diagnoses:  1. Primary osteoarthritis of right knee     Plan:   Follow-Up Instructions: Return if symptoms worsen or fail to improve.   Orders:  Orders Placed This Encounter  Procedures  . Large Joint Inj: R knee   No orders of the defined types were placed in this encounter.     Procedures: Large Joint Inj: R knee on 08/24/2018 2:04 PM Indications: pain and joint swelling Details: 25 G 1.5 in needle  Arthrogram: No  Medications: 20 mg Sodium Hyaluronate 20 MG/2ML Outcome: tolerated well, no immediate complications Procedure, treatment alternatives, risks and benefits explained, specific risks discussed. Consent was given by the patient. Immediately prior to procedure a time out was called to verify the correct patient, procedure, equipment, support staff and site/side marked as required. Patient was prepped and draped in the usual sterile fashion.       Clinical Data: No additional findings.   Subjective: Chief Complaint  Patient presents with  . Right Knee - Follow-up    Euflexxa started 08/03/18  Patient presents today for her third injection of Euflexxa in the right knee. She started the injections on 08/03/18. She feels like she has improved.  HPI  Review of Systems   Objective: Vital Signs: BP 138/90   Pulse 88   Ht 5\' 4"  (1.626 m)   Wt 200 lb (90.7 kg)   BMI 34.33 kg/m   Physical Exam  Ortho Exam  Specialty Comments:  No specialty comments available.  Imaging: No results found.   PMFS History: Patient Active Problem List   Diagnosis Date Noted  . Ganglion cyst of dorsum of left wrist 03/02/2018  . Pes planovalgus, acquired 03/28/2014  . Primary osteoarthritis  of right knee 03/28/2014  . Knee pain, right 03/28/2014  . Arthritis of knee, degenerative 01/19/2013  . Knee pain 01/19/2013  . Acute meniscal tear, medial 11/29/2012  . Obesity 02/01/2012  . H/O: hysterectomy 02/01/2012  . Ovarian cyst   . CLOSED FRACTURE OF ONE OR MORE PHALANGES OF FOOT 11/05/2009  . 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 KNEE PAIN 10/17/2008  . ANSERINE BURSITIS 10/17/2008   Past Medical History:  Diagnosis Date  . Difficulty waking    from anesthesia  . Elevated cholesterol    no current meds.  Marland Kitchen GERD (gastroesophageal reflux disease)    no current meds.  . IBS (irritable bowel syndrome)   . Nipple discharge in female 08/2012   right  . PONV (postoperative nausea and vomiting)   . Vertigo    occasional    Family History  Problem Relation Age of Onset  . Diabetes Mother   . Diabetes Sister   . Hypertension Sister   . Hypertension Brother   . Hypertension Brother   . Cancer Maternal Grandmother   . Anesthesia problems Son        wakes up very hyper  . Breast cancer Cousin     Past Surgical History:  Procedure Laterality Date  . ABDOMINAL HYSTERECTOMY  age 50   complete  . BREAST DUCTAL SYSTEM EXCISION Right 09/28/2012  Procedure: RIGHT BREAST CENTRAL DUCTAL EXCISION  ;  Surgeon: Joyice Faster. Cornett, MD;  Location: [facility];  Service: General;  Laterality: Right;  . BREAST EXCISIONAL BIOPSY Right   . CARPAL TUNNEL RELEASE Bilateral 2004  . CHOLECYSTECTOMY    . ESOPHAGOGASTRODUODENOSCOPY (EGD) WITH ESOPHAGEAL DILATION  04/04/2003; 09/13/2001  . TUBAL LIGATION     Social History   Occupational History  . Not on file  Tobacco Use  . Smoking status: Never Smoker  . Smokeless tobacco: Never Used  Substance and Sexual Activity  . Alcohol use: No  . Drug use: No  . Sexual activity: Yes    Birth control/protection: Surgical

## 2018-08-25 ENCOUNTER — Ambulatory Visit (INDEPENDENT_AMBULATORY_CARE_PROVIDER_SITE_OTHER): Payer: PRIVATE HEALTH INSURANCE | Admitting: Otolaryngology

## 2018-09-11 ENCOUNTER — Encounter (HOSPITAL_COMMUNITY): Payer: Self-pay | Admitting: Emergency Medicine

## 2018-09-11 ENCOUNTER — Emergency Department (HOSPITAL_COMMUNITY)
Admission: EM | Admit: 2018-09-11 | Discharge: 2018-09-11 | Disposition: A | Discharge status: Home / Self Care | Payer: PRIVATE HEALTH INSURANCE | Attending: Emergency Medicine | Admitting: Emergency Medicine

## 2018-09-11 ENCOUNTER — Other Ambulatory Visit: Payer: Self-pay

## 2018-09-11 ENCOUNTER — Emergency Department (HOSPITAL_COMMUNITY): Payer: PRIVATE HEALTH INSURANCE

## 2018-09-11 DIAGNOSIS — Z79899 Other long term (current) drug therapy: Secondary | ICD-10-CM | POA: Insufficient documentation

## 2018-09-11 DIAGNOSIS — J101 Influenza due to other identified influenza virus with other respiratory manifestations: Secondary | ICD-10-CM | POA: Diagnosis not present

## 2018-09-11 DIAGNOSIS — R69 Illness, unspecified: Secondary | ICD-10-CM

## 2018-09-11 DIAGNOSIS — R509 Fever, unspecified: Secondary | ICD-10-CM | POA: Diagnosis present

## 2018-09-11 DIAGNOSIS — J111 Influenza due to unidentified influenza virus with other respiratory manifestations: Secondary | ICD-10-CM

## 2018-09-11 LAB — BASIC METABOLIC PANEL
Anion gap: 7 (ref 5–15)
BUN: 5 mg/dL — AB (ref 6–20)
CHLORIDE: 103 mmol/L (ref 98–111)
CO2: 25 mmol/L (ref 22–32)
Calcium: 9.1 mg/dL (ref 8.9–10.3)
Creatinine, Ser: 0.7 mg/dL (ref 0.44–1.00)
GFR calc Af Amer: >60 mL/min (ref >60)
GFR calc non Af Amer: >60 mL/min (ref >60)
Glucose, Bld: 115 mg/dL — ABNORMAL HIGH (ref 70–99)
Potassium: 3.7 mmol/L (ref 3.5–5.1)
Sodium: 135 mmol/L (ref 135–145)

## 2018-09-11 LAB — CBC
HEMATOCRIT: 40.9 % (ref 36.0–46.0)
Hemoglobin: 12.9 g/dL (ref 12.0–15.0)
MCH: 30 pg (ref 26.0–34.0)
MCHC: 31.5 g/dL (ref 30.0–36.0)
MCV: 95.1 fL (ref 80.0–100.0)
Platelets: 194 10*3/uL (ref 150–400)
RBC: 4.3 MIL/uL (ref 3.87–5.11)
RDW: 12.7 % (ref 11.5–15.5)
WBC: 4.3 10*3/uL (ref 4.0–10.5)
nRBC: 0 % (ref 0.0–0.2)

## 2018-09-11 LAB — I-STAT TROPONIN, ED: Troponin i, poc: 0 ng/mL (ref 0.00–0.08)

## 2018-09-11 MED ORDER — SODIUM CHLORIDE 0.9% FLUSH: 3.0000 mL | Freq: Once | INTRAVENOUS | Status: DC

## 2018-09-11 MED ORDER — KETOROLAC TROMETHAMINE 15 MG/ML IJ SOLN
15.0000 mg | Freq: Once | INTRAMUSCULAR | Status: AC
  Administered 2018-09-11: 15 mg via INTRAVENOUS
  Filled 2018-09-11: qty 1

## 2018-09-11 MED ORDER — ONDANSETRON HCL 4 MG PO TABS: 4.0000 mg | ORAL_TABLET | Freq: Three times a day (TID) | ORAL | 0 refills | Status: AC | PRN

## 2018-09-11 MED ORDER — OSELTAMIVIR PHOSPHATE 75 MG PO CAPS: 75.0000 mg | ORAL_CAPSULE | Freq: Two times a day (BID) | ORAL | 0 refills | Status: AC

## 2018-09-11 MED ORDER — SODIUM CHLORIDE 0.9 % IV SOLN
8.0000 mg | Freq: Once | INTRAVENOUS | Status: AC
  Administered 2018-09-11: 8 mg via INTRAVENOUS
  Filled 2018-09-11: qty 4

## 2018-09-11 MED ORDER — OSELTAMIVIR PHOSPHATE 75 MG PO CAPS
75.0000 mg | ORAL_CAPSULE | Freq: Once | ORAL | Status: AC
  Administered 2018-09-11: 75 mg via ORAL
  Filled 2018-09-11: qty 1

## 2018-09-11 MED ORDER — KETOROLAC TROMETHAMINE 60 MG/2ML IM SOLN: 30.0000 mg | Freq: Once | INTRAMUSCULAR | Status: DC

## 2018-09-11 NOTE — ED Provider Notes (Signed)
[facility] EMERGENCY DEPARTMENT Provider Note   CSN: [id number] Arrival date & time: 09/11/18  0150    History   Chief Complaint Chief Complaint  Patient presents with  . Cough  . Fever    HPI Christie Manning is a 58 y.o. female.     The history is provided by the patient and medical records.  Cough  Cough characteristics:  Non-productive Sputum characteristics:  Nondescript Associated symptoms: fever   Fever  Temp source:  Oral Severity:  Moderate Onset quality:  Gradual Timing:  Constant Chronicity:  New Relieved by:  Nothing Worsened by:  Nothing Ineffective treatments:  None tried Associated symptoms: cough     Past Medical History:  Diagnosis Date  . Difficulty waking    from anesthesia  . Elevated cholesterol    no current meds.  Marland Kitchen GERD (gastroesophageal reflux disease)    no current meds.  . IBS (irritable bowel syndrome)   . Nipple discharge in female 08/2012   right  . PONV (postoperative nausea and vomiting)   . Vertigo    occasional    Patient Active Problem List   Diagnosis Date Noted  . Ganglion cyst of dorsum of left wrist 03/02/2018  . Pes planovalgus, acquired 03/28/2014  . Primary osteoarthritis of right knee 03/28/2014  . Knee pain, right 03/28/2014  . Arthritis of knee, degenerative 01/19/2013  . Knee pain 01/19/2013  . Acute meniscal tear, medial 11/29/2012  . Obesity 02/01/2012  . H/O: hysterectomy 02/01/2012  . Ovarian cyst   . CLOSED FRACTURE OF ONE OR MORE PHALANGES OF FOOT 11/05/2009  . 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 KNEE PAIN 10/17/2008  . ANSERINE BURSITIS 10/17/2008    Past Surgical History:  Procedure Laterality Date  . ABDOMINAL HYSTERECTOMY  age 14   complete  . BREAST DUCTAL SYSTEM EXCISION Right 09/28/2012   Procedure: RIGHT BREAST CENTRAL DUCTAL EXCISION  ;  Surgeon: Joyice Faster. Cornett, MD;  Location: [facility];  Service: General;  Laterality: Right;  . BREAST  EXCISIONAL BIOPSY Right   . CARPAL TUNNEL RELEASE Bilateral 2004  . CHOLECYSTECTOMY    . ESOPHAGOGASTRODUODENOSCOPY (EGD) WITH ESOPHAGEAL DILATION  04/04/2003; 09/13/2001  . TUBAL LIGATION       OB History    Gravida  2   Para  2   Term      Preterm      AB      Living        SAB      TAB      Ectopic      Multiple      Live Births               Home Medications    Prior to Admission medications   Medication Sig Start Date End Date Taking? Authorizing Provider  Coenzyme Q10 (CO Q 10 PO) Take 1 capsule by mouth daily.    [provider]  dexlansoprazole (DEXILANT) 60 MG capsule Take 60 mg by mouth daily.    [provider]  famotidine (PEPCID) 20 MG tablet Take 1 tablet (20 mg total) by mouth 2 (two) times daily. 6/76/19   Delora Fuel, MD  ibuprofen (ADVIL,MOTRIN) 800 MG tablet TAKE 1 TAB AS NEEDED FOR PAIN 05/16/18   Garald Balding, MD  metoprolol tartrate (LOPRESSOR) 25 MG tablet Take 0.5 tablets (12.5 mg total) by mouth 2 (two) times daily. 12/07/16   Isla Pence, MD  ondansetron (ZOFRAN) 4 MG tablet  Take 1 tablet (4 mg total) by mouth every 8 (eight) hours as needed for nausea or vomiting. 09/11/18   Kainat Pizana, Corene Cornea, MD  oseltamivir (TAMIFLU) 75 MG capsule Take 1 capsule (75 mg total) by mouth every 12 (twelve) hours. 09/11/18   Mostafa Yuan, Corene Cornea, MD  pantoprazole (PROTONIX) 20 MG tablet Take 1 tablet (20 mg total) by mouth 2 (two) times daily. 11/24/16   Julianne Rice, MD  sucralfate (CARAFATE) 1 g tablet Take 1 tablet (1 g total) by mouth 3 (three) times daily as needed. 11/24/16   Julianne Rice, MD    Family History Family History  Problem Relation Age of Onset  . Diabetes Mother   . Diabetes Sister   . Hypertension Sister   . Hypertension Brother   . Hypertension Brother   . Cancer Maternal Grandmother   . Anesthesia problems Son        wakes up very hyper  . Breast cancer Cousin     Social History Social History    Tobacco Use  . Smoking status: Never Smoker  . Smokeless tobacco: Never Used  Substance Use Topics  . Alcohol use: No  . Drug use: No     Allergies   Azithromycin; Cefdinir; Aspirin; Doxycycline; Sulfa antibiotics; and Amoxicillin   Review of Systems Review of Systems  Constitutional: Positive for fever.  Respiratory: Positive for cough.   All other systems reviewed and are negative.    Physical Exam Updated Vital Signs BP 115/70 (BP Location: Right Arm)   Pulse 95   Temp 99.1 F (37.3 C) (Oral)   Resp (!) 21   SpO2 95%   Physical Exam Vitals signs and nursing note reviewed.  Constitutional:      Appearance: She is well-developed.  HENT:     Head: Normocephalic and atraumatic.  Eyes:     Extraocular Movements: Extraocular movements intact.     Conjunctiva/sclera: Conjunctivae normal.     Pupils: Pupils are equal, round, and reactive to light.  Neck:     Musculoskeletal: Normal range of motion.  Cardiovascular:     Rate and Rhythm: Regular rhythm. Tachycardia present.  Pulmonary:     Effort: No respiratory distress.     Breath sounds: No stridor.  Abdominal:     General: There is no distension.  Neurological:     Mental Status: She is alert.      ED Treatments / Results  Labs (all labs ordered are listed, but only abnormal results are displayed) Labs Reviewed  BASIC METABOLIC PANEL - Abnormal; Notable for the following components:      Result Value   Glucose, Bld 115 (*)    BUN 5 (*)    All other components within normal limits  CBC  I-STAT TROPONIN, ED    EKG None  Radiology Dg Chest 2 View  Result Date: 09/11/2018 CLINICAL DATA:  Chest pain EXAM: CHEST - 2 VIEW COMPARISON:  05/02/2018 FINDINGS: The heart size and mediastinal contours are within normal limits. Both lungs are clear. The visualized skeletal structures are unremarkable. IMPRESSION: No active cardiopulmonary disease. Electronically Signed   By: Ulyses Jarred M.D.   On: 09/11/2018  02:22    Procedures Procedures (including critical care time)  Medications Ordered in ED Medications  sodium chloride flush (NS) 0.9 % injection 3 mL (has no administration in time range)  oseltamivir (TAMIFLU) capsule 75 mg (75 mg Oral Given 09/11/18 0526)  ondansetron (ZOFRAN) 8 mg in sodium chloride 0.9 % 50 mL IVPB (0  mg Intravenous Stopped 09/11/18 0541)  ketorolac (TORADOL) 15 MG/ML injection 15 mg (15 mg Intravenous Given 09/11/18 0526)     Initial Impression / Assessment and Plan / ED Course  I have reviewed the triage vital signs and the nursing notes.  Pertinent labs & imaging results that were available during my care of the patient were reviewed by me and considered in my medical decision making (see chart for details).        Here with influenza-like illness.  Treated for same.  Tolerating p.o.  Heart rate improved with defervesced since doubt myocarditis.  Stable for discharge with Zofran and Tamiflu.  Final Clinical Impressions(s) / ED Diagnoses   Final diagnoses:  Influenza-like illness    ED Discharge Orders         Ordered    ondansetron (ZOFRAN) 4 MG tablet  Every 8 hours PRN     09/11/18 0722    oseltamivir (TAMIFLU) 75 MG capsule  Every 12 hours     09/11/18 0722           Kairee Kozma, Corene Cornea, MD 09/11/18 [phone]

## 2018-09-11 NOTE — ED Triage Notes (Signed)
Pt reports chills and fever that started yesterday- high at home 102. Pt states today she was having chest pain in the center that feels like, "gas".

## 2019-02-20 ENCOUNTER — Ambulatory Visit (INDEPENDENT_AMBULATORY_CARE_PROVIDER_SITE_OTHER): Payer: PRIVATE HEALTH INSURANCE | Admitting: Otolaryngology

## 2019-02-20 DIAGNOSIS — J320 Chronic maxillary sinusitis: Secondary | ICD-10-CM

## 2019-02-20 DIAGNOSIS — J310 Chronic rhinitis: Secondary | ICD-10-CM | POA: Diagnosis not present

## 2019-02-20 DIAGNOSIS — J343 Hypertrophy of nasal turbinates: Secondary | ICD-10-CM | POA: Diagnosis not present

## 2019-03-31 ENCOUNTER — Other Ambulatory Visit: Payer: Self-pay | Admitting: Family Medicine

## 2019-03-31 DIAGNOSIS — Z1231 Encounter for screening mammogram for malignant neoplasm of breast: Secondary | ICD-10-CM

## 2019-04-04 ENCOUNTER — Ambulatory Visit: Payer: PRIVATE HEALTH INSURANCE

## 2019-04-11 ENCOUNTER — Ambulatory Visit
Admission: RE | Admit: 2019-04-11 | Discharge: 2019-04-11 | Disposition: A | Discharge status: Home / Self Care | Payer: PRIVATE HEALTH INSURANCE | Source: Ambulatory Visit | Attending: Family Medicine | Admitting: Family Medicine

## 2019-04-11 ENCOUNTER — Other Ambulatory Visit: Payer: Self-pay

## 2019-04-11 DIAGNOSIS — Z1231 Encounter for screening mammogram for malignant neoplasm of breast: Secondary | ICD-10-CM

## 2019-04-19 ENCOUNTER — Ambulatory Visit: Payer: PRIVATE HEALTH INSURANCE | Admitting: Orthopaedic Surgery

## 2019-04-19 ENCOUNTER — Other Ambulatory Visit: Payer: Self-pay

## 2019-06-14 ENCOUNTER — Ambulatory Visit: Payer: PRIVATE HEALTH INSURANCE | Admitting: Orthopaedic Surgery

## 2019-06-15 ENCOUNTER — Ambulatory Visit: Payer: PRIVATE HEALTH INSURANCE | Admitting: Orthopaedic Surgery

## 2019-06-28 ENCOUNTER — Ambulatory Visit: Payer: Self-pay

## 2019-06-28 ENCOUNTER — Encounter: Payer: Self-pay | Admitting: Orthopaedic Surgery

## 2019-06-28 ENCOUNTER — Ambulatory Visit (INDEPENDENT_AMBULATORY_CARE_PROVIDER_SITE_OTHER): Payer: PRIVATE HEALTH INSURANCE | Admitting: Orthopaedic Surgery

## 2019-06-28 ENCOUNTER — Other Ambulatory Visit: Payer: Self-pay

## 2019-06-28 VITALS — Ht 64.0 in | Wt 209.0 lb

## 2019-06-28 DIAGNOSIS — M25562 Pain in left knee: Secondary | ICD-10-CM

## 2019-06-28 NOTE — Progress Notes (Signed)
Office Visit Note   Patient: Christie Manning           Date of Birth: 04/16/1961           MRN: [id number] Visit Date: 06/28/2019              Requested by: Sharilyn Sites, [address] [place],  [place] [address] PCP: Sharilyn Sites, MD   Assessment & Plan: Visit Diagnoses:  1. Acute pain of left knee     Plan: Christie Manning struck the anterior lateral aspect of her left knee against a hard object about 2 weeks ago and has been having trouble since that time.  She can still walk long distances not using any ambulatory aid.  She is not noted any skin changes.  Her x-rays demonstrate some arthritis and I suspect she has had an exacerbation.  She will try Voltaren gel and return in 2 weeks if no improvement consider cortisone injection  Follow-Up Instructions: Return in about 2 weeks (around 07/12/2019), or if symptoms worsen or fail to improve.   Orders:  Orders Placed This Encounter  Procedures  . XR KNEE 3 VIEW LEFT   No orders of the defined types were placed in this encounter.     Procedures: No procedures performed   Clinical Data: No additional findings.   Subjective: Chief Complaint  Patient presents with  . Left Knee - Pain, Injury  Patient presents today for left knee pain. She was carrying boxes up stairs almost two weeks ago and hit her knee on the railing. She has been having pain and swelling anteriorly since. She feels like the pain has not spread all throughout her leg. She takes Tylenol as needed. She has been applying heat and ice. She continues to exercise.  No related fever or chills.  No skin changes about the left knee.  She does have history of osteoarthritis in her right knee and has had cortisone and viscosupplementation.  Has not had any compromise her activities and in fact can walk up to 3 miles a day  HPI  Review of Systems   Objective: Vital Signs: Ht 5\' 4"  (1.626 m)   Wt 209 lb (94.8 kg)   BMI 35.87 kg/m   Physical  Exam Constitutional:      Appearance: She is well-developed.  Eyes:     Pupils: Pupils are equal, round, and reactive to light.  Pulmonary:     Effort: Pulmonary effort is normal.  Skin:    General: Skin is warm and dry.  Neurological:     Mental Status: She is alert and oriented to person, place, and time.  Psychiatric:        Behavior: Behavior normal.     Ortho Exam awake alert and oriented x3.  Comfortable sitting.  Large knees but I do not think there is an effusion of her left knee.  No skin changes or bruising.  Full extension of flexed about 100 degrees without instability.  Mild lateral joint pain.  No pain medially.  Minimal patellar crepitation and no pain with patella compression.  Neurologically intact  Specialty Comments:  No specialty comments available.  Imaging: XR KNEE 3 VIEW LEFT  Result Date: 06/28/2019 Films of the left knee obtained in several projections standing.  There are degenerative changes in all 3 compartments similar to the right knee.  Alignment appears to be neutral.  There are osteophytes in both medial lateral compartments with subchondral sclerosis more in the lateral tibia  than on the right.  Joint space is more narrowed laterally than medially.  There are osteophytes about the patellofemoral joint as well.  Films are consistent with moderate to advanced osteoarthritis.  No acute changes or ectopic calcification    PMFS History: Patient Active Problem List   Diagnosis Date Noted  . Ganglion cyst of dorsum of left wrist 03/02/2018  . Pes planovalgus, acquired 03/28/2014  . Primary osteoarthritis of right knee 03/28/2014  . Knee pain, right 03/28/2014  . Arthritis of knee, degenerative 01/19/2013  . Pain in left knee 01/19/2013  . Acute meniscal tear, medial 11/29/2012  . Obesity 02/01/2012  . H/O: hysterectomy 02/01/2012  . Ovarian cyst   . CLOSED FRACTURE OF ONE OR MORE PHALANGES OF FOOT 11/05/2009  . 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 KNEE PAIN 10/17/2008  . ANSERINE BURSITIS 10/17/2008   Past Medical History:  Diagnosis Date  . Difficulty waking    from anesthesia  . Elevated cholesterol    no current meds.  Marland Kitchen GERD (gastroesophageal reflux disease)    no current meds.  . IBS (irritable bowel syndrome)   . Nipple discharge in female 08/2012   right  . PONV (postoperative nausea and vomiting)   . Vertigo    occasional    Family History  Problem Relation Age of Onset  . Diabetes Mother   . Diabetes Sister   . Hypertension Sister   . Hypertension Brother   . Hypertension Brother   . Cancer Maternal Grandmother   . Anesthesia problems Son        wakes up very hyper  . Breast cancer Cousin     Past Surgical History:  Procedure Laterality Date  . ABDOMINAL HYSTERECTOMY  age 51   complete  . BREAST DUCTAL SYSTEM EXCISION Right 09/28/2012   Procedure: RIGHT BREAST CENTRAL DUCTAL EXCISION  ;  Surgeon: Joyice Faster. Cornett, MD;  Location: [facility];  Service: General;  Laterality: Right;  . BREAST EXCISIONAL BIOPSY Right   . CARPAL TUNNEL RELEASE Bilateral 2004  . CHOLECYSTECTOMY    . ESOPHAGOGASTRODUODENOSCOPY (EGD) WITH ESOPHAGEAL DILATION  04/04/2003; 09/13/2001  . TUBAL LIGATION     Social History   Occupational History  . Not on file  Tobacco Use  . Smoking status: Never Smoker  . Smokeless tobacco: Never Used  Substance and Sexual Activity  . Alcohol use: No  . Drug use: No  . Sexual activity: Yes    Birth control/protection: Surgical

## 2019-06-29 ENCOUNTER — Telehealth: Payer: Self-pay | Admitting: Orthopaedic Surgery

## 2019-06-29 NOTE — Telephone Encounter (Signed)
Please get precert for Left knee euflexxa injections. This is Dr.Whitfield's patient.

## 2019-06-29 NOTE — Telephone Encounter (Signed)
Noted.  Will submit after 07/03/2019.

## 2019-06-29 NOTE — Telephone Encounter (Signed)
Message has been sent for precert.

## 2019-06-29 NOTE — Telephone Encounter (Signed)
Patient called asked if the Euflexxa could be called in to get approved by the insurance? The number to contact patient is [phone]

## 2019-06-29 NOTE — Telephone Encounter (Signed)
Please advise 

## 2019-06-29 NOTE — Telephone Encounter (Signed)
Ok to pre cert

## 2019-07-11 ENCOUNTER — Telehealth: Payer: Self-pay

## 2019-07-11 NOTE — Telephone Encounter (Signed)
error 

## 2019-07-11 NOTE — Telephone Encounter (Signed)
Submitted VOB for Euflexxa series, left knee.

## 2019-07-12 ENCOUNTER — Ambulatory Visit: Payer: PRIVATE HEALTH INSURANCE | Admitting: Orthopaedic Surgery

## 2019-07-12 ENCOUNTER — Other Ambulatory Visit: Payer: Self-pay

## 2019-07-13 ENCOUNTER — Telehealth: Payer: Self-pay

## 2019-07-13 NOTE — Telephone Encounter (Signed)
Per VOB, insurance card and correct group# needed to be faxed to [employer]. Faxed copy of insurance card to [employer] at [phone].

## 2019-07-24 ENCOUNTER — Telehealth: Payer: Self-pay

## 2019-07-24 NOTE — Telephone Encounter (Signed)
Talked with patient concerning gel injection being received through the [facility] due to injection not being covered under her medical benefits.  Advised patient that once gel injection has been approved and received, she will get a call to schedule an appointment.  Patient voiced that she understands.

## 2019-07-24 NOTE — Telephone Encounter (Signed)
Called and talked with Tiffany at [facility] concerning patient's benefits for Euflexxa, left knee.  Per Tiffany patient is not able to receive gel injection under medical and has to be obtained through the specialty pharmacy [facility]. Called and gave a verbal order for Euflexxa, left knee with Alden Benjamin, pharmacist at [facility].

## 2019-07-25 ENCOUNTER — Telehealth: Payer: Self-pay

## 2019-07-25 NOTE — Telephone Encounter (Signed)
PA required for Euflexxa, left knee through [facility]. Submitted PA through [employer] for Euflexxa, left knee.

## 2019-07-26 ENCOUNTER — Ambulatory Visit: Payer: PRIVATE HEALTH INSURANCE | Admitting: Orthopaedic Surgery

## 2019-07-27 ENCOUNTER — Telehealth: Payer: Self-pay

## 2019-07-27 NOTE — Telephone Encounter (Signed)
Received a notice of denial for Euflexxa injection, left knee from [facility] stating medication is not a covered benefit. Plan will only cover a drug when it is used for a medically accepted indication. Could you please advise on what the next option would be for the patient?  Thank you

## 2019-07-27 NOTE — Telephone Encounter (Signed)
Noted, will call patient.

## 2019-07-27 NOTE — Telephone Encounter (Signed)
Please advise 

## 2019-07-27 NOTE — Telephone Encounter (Signed)
Return to office for cortisone if euflexxa denied

## 2019-08-03 ENCOUNTER — Telehealth: Payer: Self-pay

## 2019-08-03 NOTE — Telephone Encounter (Signed)
Called and left a VM for patient to call back to discuss gel injection.

## 2019-08-03 NOTE — Telephone Encounter (Signed)
Left a VM for patient to call back to discuss gel injection.  Previous message in patient's chart.

## 2019-08-09 ENCOUNTER — Encounter: Payer: Self-pay | Admitting: Orthopaedic Surgery

## 2019-08-09 ENCOUNTER — Ambulatory Visit (INDEPENDENT_AMBULATORY_CARE_PROVIDER_SITE_OTHER): Payer: PRIVATE HEALTH INSURANCE | Admitting: Orthopaedic Surgery

## 2019-08-09 ENCOUNTER — Other Ambulatory Visit: Payer: Self-pay

## 2019-08-09 VITALS — Ht 64.0 in | Wt 209.0 lb

## 2019-08-09 DIAGNOSIS — M1712 Unilateral primary osteoarthritis, left knee: Secondary | ICD-10-CM | POA: Diagnosis not present

## 2019-08-09 MED ORDER — BUPIVACAINE HCL 0.5 % IJ SOLN
2.0000 mL | INTRAMUSCULAR | Status: AC | PRN
  Administered 2019-08-09: 11:00:00 2 mL via INTRA_ARTICULAR

## 2019-08-09 MED ORDER — METHYLPREDNISOLONE ACETATE 40 MG/ML IJ SUSP
80.0000 mg | INTRAMUSCULAR | Status: AC | PRN
  Administered 2019-08-09: 80 mg via INTRA_ARTICULAR

## 2019-08-09 MED ORDER — LIDOCAINE HCL 1 % IJ SOLN
2.0000 mL | INTRAMUSCULAR | Status: AC | PRN
  Administered 2019-08-09: 2 mL

## 2019-08-09 NOTE — Progress Notes (Signed)
Office Visit Note   Patient: Christie Manning           Date of Birth: 1961/05/09           MRN: [id number] Visit Date: 08/09/2019              Requested by: Sharilyn Sites, [address] [place],  [place] [address] PCP: Sharilyn Sites, MD   Assessment & Plan: Visit Diagnoses:  1. Primary osteoarthritis of left knee     Plan: Recurrent symptoms of osteoarthritis left knee.  Will inject with cortisone and monitor response  Follow-Up Instructions: Return if symptoms worsen or fail to improve.   Orders:  Orders Placed This Encounter  Procedures  . Large Joint Inj: L knee   No orders of the defined types were placed in this encounter.     Procedures: Large Joint Inj: L knee on 08/09/2019 11:24 AM Indications: pain and diagnostic evaluation Details: 25 G 1.5 in needle, anteromedial approach  Arthrogram: No  Medications: 2 mL lidocaine 1 %; 2 mL bupivacaine 0.5 %; 80 mg methylPREDNISolone acetate 40 MG/ML Procedure, treatment alternatives, risks and benefits explained, specific risks discussed. Consent was given by the patient. Patient was prepped and draped in the usual sterile fashion.       Clinical Data: No additional findings.   Subjective: Chief Complaint  Patient presents with  . Left Knee - Pain  Patient presents today for recurrent left knee pain. She is having pain anteriorly. Her pain is worse with weightbearing. She is wanting a cortisone injection today. She takes tylenol as needed.  X-rays of the left knee were obtained in December consistent with advanced osteoarthritis in all 3 compartments  HPI  Review of Systems   Objective: Vital Signs: Ht 5\' 4"  (1.626 m)   Wt 209 lb (94.8 kg)   BMI 35.87 kg/m   Physical Exam Constitutional:      Appearance: She is well-developed.  Eyes:     Pupils: Pupils are equal, round, and reactive to light.  Pulmonary:     Effort: Pulmonary effort is normal.  Skin:    General: Skin is warm and dry.    Neurological:     Mental Status: She is alert and oriented to person, place, and time.  Psychiatric:        Behavior: Behavior normal.     Ortho Exam awake alert and oriented x3.  Comfortable sitting.  Left knee was not hot warm or red.  No obvious effusion.  Mostly medial joint pain.  There was some mild lateral joint pain and some patellar crepitation.  Full extension and flexion over 100 degrees without instability.  No calf or popliteal pain.  Motor exam intact.  Straight leg raise negative.  Painless range of motion left hip  Specialty Comments:  No specialty comments available.  Imaging: No results found.   PMFS History: Patient Active Problem List   Diagnosis Date Noted  . Ganglion cyst of dorsum of left wrist 03/02/2018  . Pes planovalgus, acquired 03/28/2014  . Primary osteoarthritis of right knee 03/28/2014  . Knee pain, right 03/28/2014  . Arthritis of knee, degenerative 01/19/2013  . Pain in left knee 01/19/2013  . Acute meniscal tear, medial 11/29/2012  . Obesity 02/01/2012  . H/O: hysterectomy 02/01/2012  . Ovarian cyst   . CLOSED FRACTURE OF ONE OR MORE PHALANGES OF FOOT 11/05/2009  . 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 KNEE PAIN 10/17/2008  . ANSERINE BURSITIS 10/17/2008   Past  Medical History:  Diagnosis Date  . Difficulty waking    from anesthesia  . Elevated cholesterol    no current meds.  Marland Kitchen GERD (gastroesophageal reflux disease)    no current meds.  . IBS (irritable bowel syndrome)   . Nipple discharge in female 08/2012   right  . PONV (postoperative nausea and vomiting)   . Vertigo    occasional    Family History  Problem Relation Age of Onset  . Diabetes Mother   . Diabetes Sister   . Hypertension Sister   . Hypertension Brother   . Hypertension Brother   . Cancer Maternal Grandmother   . Anesthesia problems Son        wakes up very hyper  . Breast cancer Cousin     Past Surgical History:  Procedure Laterality Date  . ABDOMINAL  HYSTERECTOMY  age 59   complete  . BREAST DUCTAL SYSTEM EXCISION Right 09/28/2012   Procedure: RIGHT BREAST CENTRAL DUCTAL EXCISION  ;  Surgeon: Joyice Faster. Cornett, MD;  Location: [facility];  Service: General;  Laterality: Right;  . BREAST EXCISIONAL BIOPSY Right   . CARPAL TUNNEL RELEASE Bilateral 2004  . CHOLECYSTECTOMY    . ESOPHAGOGASTRODUODENOSCOPY (EGD) WITH ESOPHAGEAL DILATION  04/04/2003; 09/13/2001  . TUBAL LIGATION     Social History   Occupational History  . Not on file  Tobacco Use  . Smoking status: Never Smoker  . Smokeless tobacco: Never Used  Substance and Sexual Activity  . Alcohol use: No  . Drug use: No  . Sexual activity: Yes    Birth control/protection: Surgical

## 2019-09-09 ENCOUNTER — Ambulatory Visit: Payer: PRIVATE HEALTH INSURANCE

## 2019-11-01 ENCOUNTER — Ambulatory Visit: Payer: PRIVATE HEALTH INSURANCE | Admitting: Orthopaedic Surgery

## 2020-02-14 IMAGING — MG DIGITAL SCREENING BILAT W/ TOMO W/ CAD
8 series · 8 of 24 positions shown · non-contrast
Comparison: Previous exam(s).

ACR Breast Density Category a: The breast tissue is almost entirely
fatty.

CLINICAL DATA: Screening.

EXAM:
DIGITAL SCREENING BILATERAL MAMMOGRAM WITH TOMO AND CAD

[L CC synth-2D]
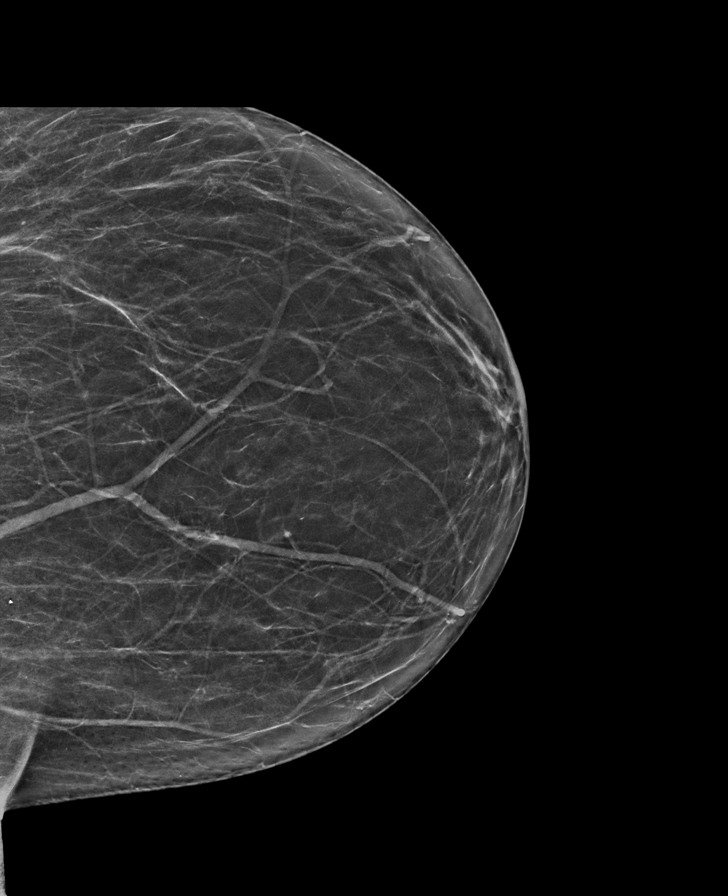

[R MLO synth-2D]
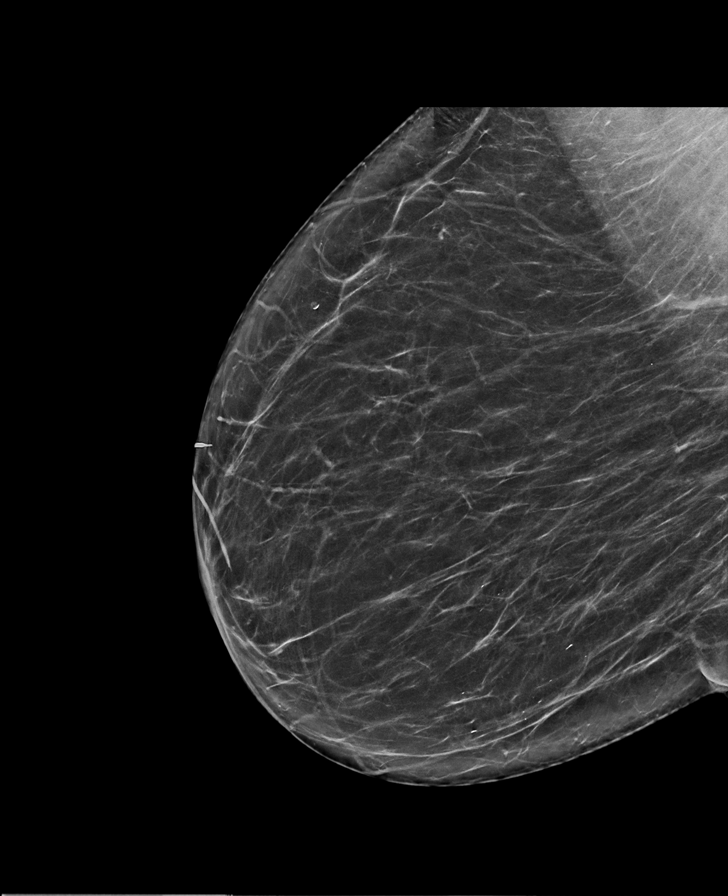

[L MLO synth-2D]
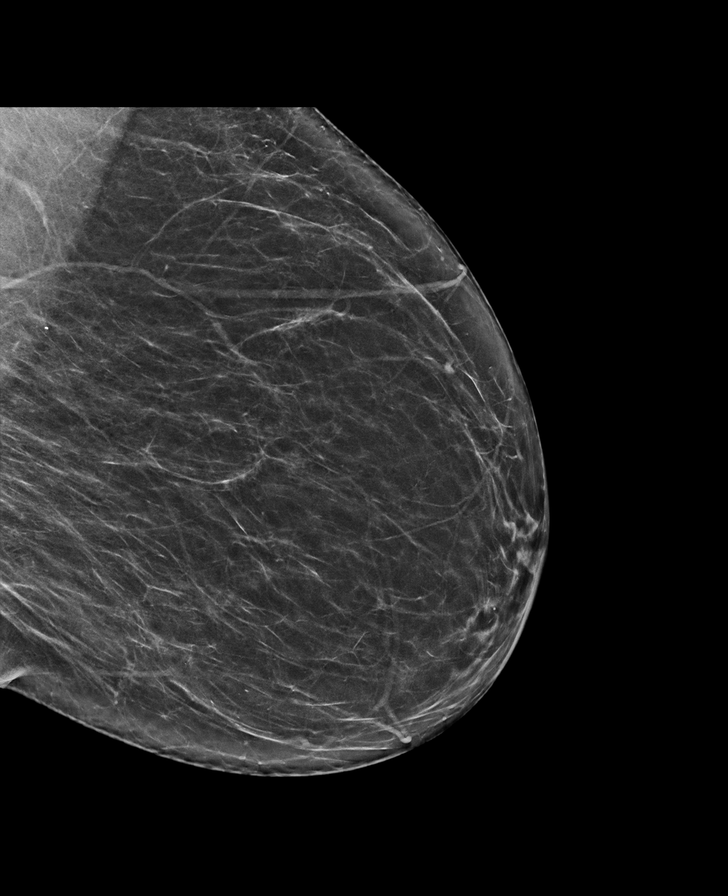

[R CC synth-2D]
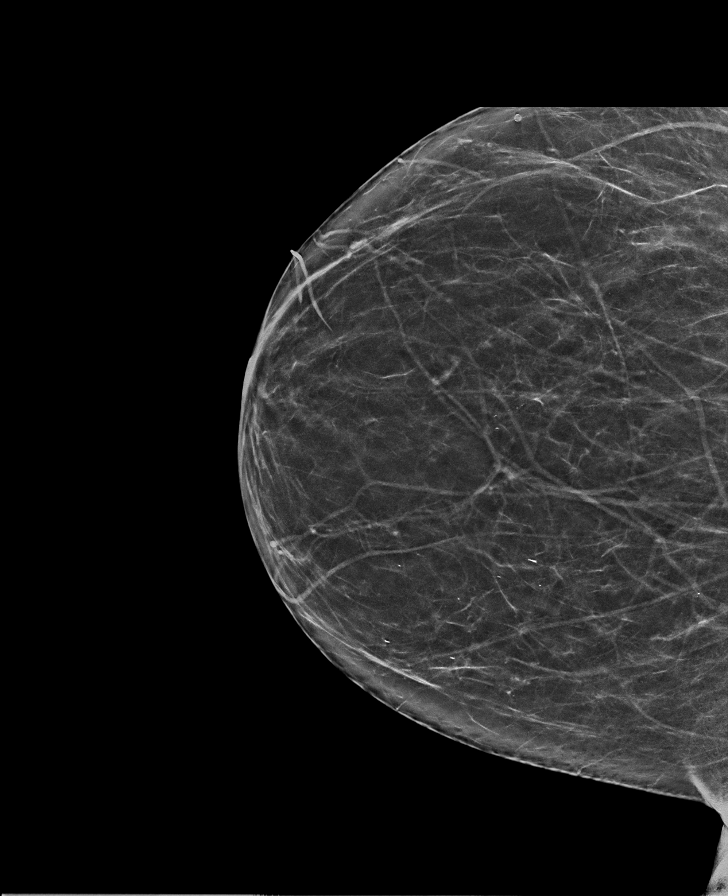

[L MLO tomo · tomo slice 39/78.0]
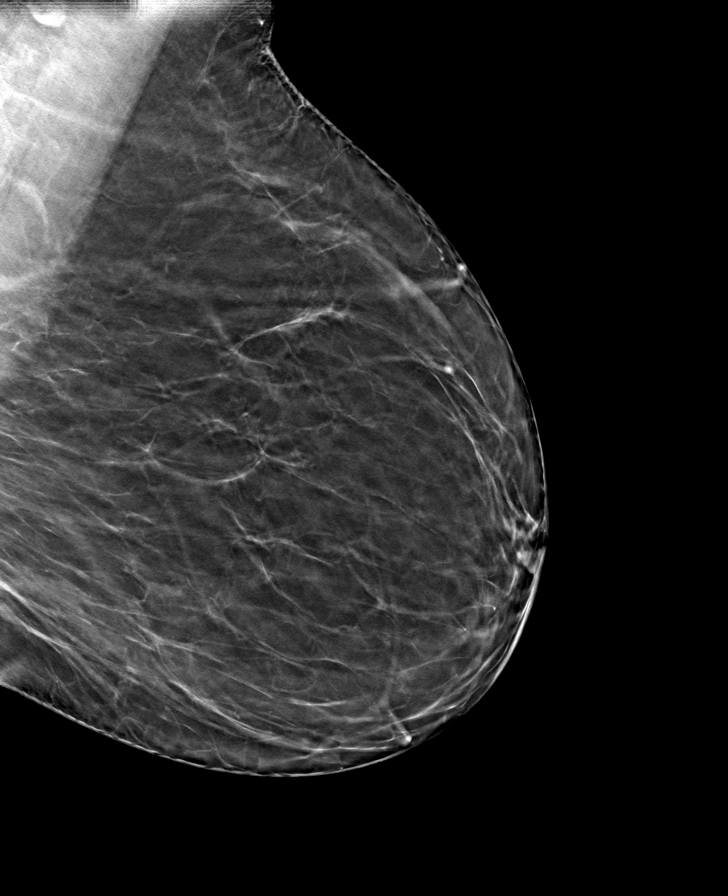

[R CC tomo · tomo slice 35/70.0]
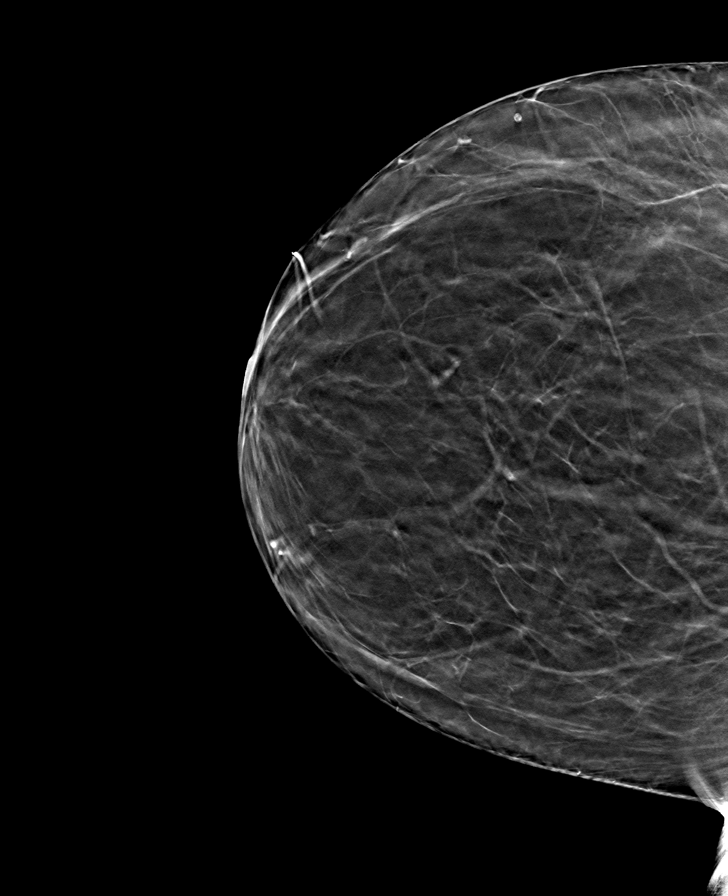

[L CC tomo · tomo slice 34/67.0]
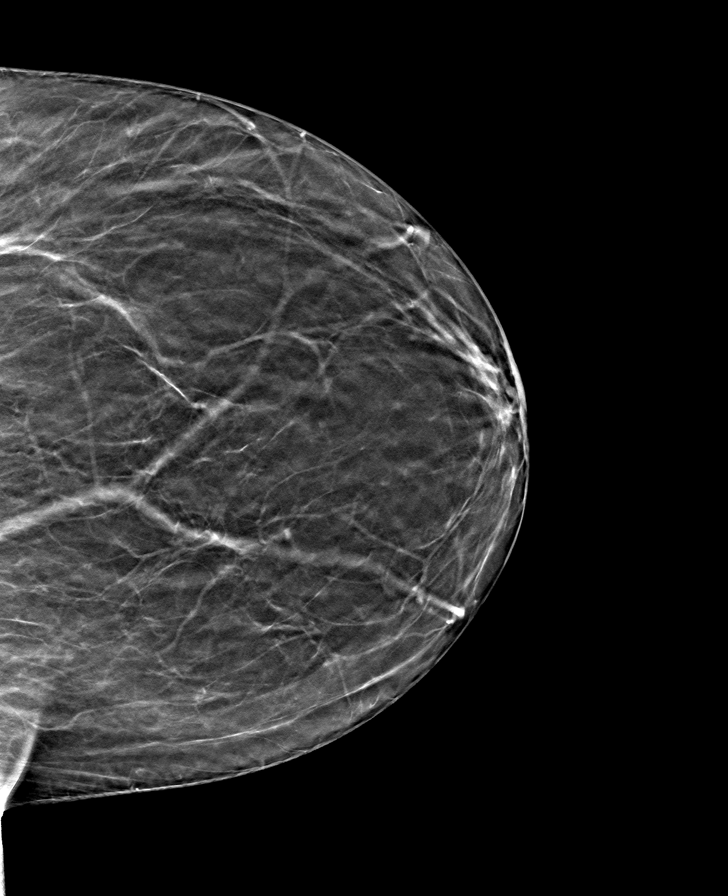

[R MLO tomo · tomo slice 41/81.0]
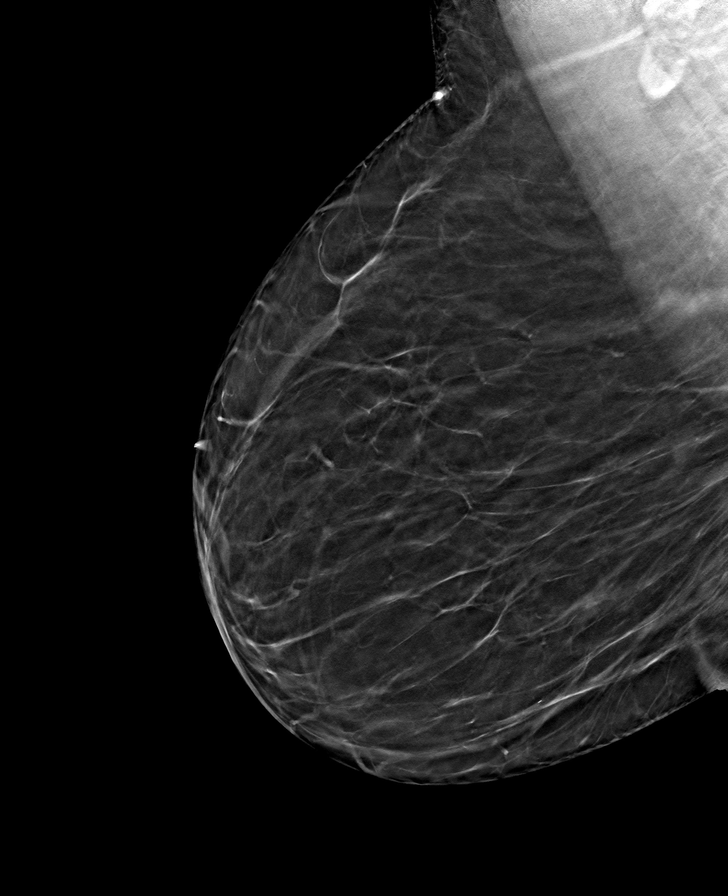

[8 of 24 positions shown; findings below may reference images not displayed]

FINDINGS: There are no findings suspicious for malignancy. Images were
processed with CAD.
IMPRESSION: No mammographic evidence of malignancy. A result letter of this
screening mammogram will be mailed directly to the patient.

RECOMMENDATION:
Screening mammogram in one year. (Code:8Y-[id number])

BI-RADS CATEGORY  1: Negative.

## 2020-03-08 ENCOUNTER — Other Ambulatory Visit: Payer: Self-pay | Admitting: Family Medicine

## 2020-03-08 DIAGNOSIS — Z1231 Encounter for screening mammogram for malignant neoplasm of breast: Secondary | ICD-10-CM

## 2020-03-20 ENCOUNTER — Other Ambulatory Visit (HOSPITAL_COMMUNITY): Payer: Self-pay | Admitting: Pulmonary Disease

## 2020-03-21 ENCOUNTER — Other Ambulatory Visit (HOSPITAL_COMMUNITY): Payer: Self-pay | Admitting: Family Medicine

## 2020-03-21 DIAGNOSIS — N644 Mastodynia: Secondary | ICD-10-CM

## 2020-03-25 ENCOUNTER — Other Ambulatory Visit: Payer: Self-pay | Admitting: Family Medicine

## 2020-03-25 DIAGNOSIS — N644 Mastodynia: Secondary | ICD-10-CM

## 2020-04-08 ENCOUNTER — Other Ambulatory Visit: Payer: Self-pay

## 2020-04-08 ENCOUNTER — Ambulatory Visit
Admission: RE | Admit: 2020-04-08 | Discharge: 2020-04-08 | Disposition: A | Discharge status: Home / Self Care | Payer: PRIVATE HEALTH INSURANCE | Source: Ambulatory Visit | Attending: Family Medicine | Admitting: Family Medicine

## 2020-04-08 ENCOUNTER — Ambulatory Visit: Payer: PRIVATE HEALTH INSURANCE

## 2020-04-08 DIAGNOSIS — N644 Mastodynia: Secondary | ICD-10-CM

## 2020-04-11 ENCOUNTER — Other Ambulatory Visit: Payer: PRIVATE HEALTH INSURANCE

## 2020-04-11 ENCOUNTER — Ambulatory Visit: Payer: PRIVATE HEALTH INSURANCE

## 2020-08-22 ENCOUNTER — Other Ambulatory Visit: Payer: Self-pay | Admitting: Gastroenterology

## 2020-08-22 DIAGNOSIS — R1084 Generalized abdominal pain: Secondary | ICD-10-CM

## 2020-09-10 ENCOUNTER — Other Ambulatory Visit: Payer: PRIVATE HEALTH INSURANCE

## 2021-03-04 ENCOUNTER — Other Ambulatory Visit: Payer: Self-pay | Admitting: Family Medicine

## 2021-03-04 DIAGNOSIS — Z1231 Encounter for screening mammogram for malignant neoplasm of breast: Secondary | ICD-10-CM

## 2021-03-19 ENCOUNTER — Ambulatory Visit: Payer: PRIVATE HEALTH INSURANCE | Admitting: Orthopaedic Surgery

## 2021-04-02 ENCOUNTER — Other Ambulatory Visit: Payer: Self-pay

## 2021-04-02 ENCOUNTER — Ambulatory Visit: Payer: Self-pay | Admitting: Orthopaedic Surgery

## 2021-04-11 ENCOUNTER — Ambulatory Visit: Payer: PRIVATE HEALTH INSURANCE

## 2021-04-16 ENCOUNTER — Ambulatory Visit: Payer: Self-pay

## 2021-04-16 ENCOUNTER — Ambulatory Visit: Payer: No Typology Code available for payment source | Admitting: Orthopaedic Surgery

## 2021-04-16 ENCOUNTER — Ambulatory Visit (INDEPENDENT_AMBULATORY_CARE_PROVIDER_SITE_OTHER): Payer: No Typology Code available for payment source | Admitting: Orthopaedic Surgery

## 2021-04-16 ENCOUNTER — Other Ambulatory Visit: Payer: Self-pay

## 2021-04-16 ENCOUNTER — Encounter: Payer: Self-pay | Admitting: Orthopaedic Surgery

## 2021-04-16 VITALS — Ht 64.0 in | Wt 230.0 lb

## 2021-04-16 DIAGNOSIS — M25562 Pain in left knee: Secondary | ICD-10-CM

## 2021-04-16 MED ORDER — LIDOCAINE HCL 1 % IJ SOLN
2.0000 mL | INTRAMUSCULAR | Status: AC | PRN
  Administered 2021-04-16: 2 mL

## 2021-04-16 MED ORDER — METHYLPREDNISOLONE ACETATE 40 MG/ML IJ SUSP
40.0000 mg | INTRAMUSCULAR | Status: AC | PRN
  Administered 2021-04-16: 40 mg via INTRA_ARTICULAR

## 2021-04-16 NOTE — Progress Notes (Addendum)
Office Visit Note   Patient: Christie Manning           Date of Birth: 03-14-1961           MRN: [id number] Visit Date: 04/16/2021              Requested by: Sharilyn Sites, [address] [place],  [place] [address] PCP: Sharilyn Sites, MD   Assessment & Plan: Visit Diagnoses: No diagnosis found.  Plan: Patient with a history of bilateral arthritis.  Presents today with pain in her left knee.  She says this began after she was moving furniture few weeks ago.  She did not have any other injury.  She has had Euflexxa injections in the past as well as steroid.  She said she has done well with steroid.  We will go through with a cortisone injection into her lateral knee as she is significantly valgus and most of her knee is laterally.  Would follow-up as needed.  She is going to work on weight loss her current BMI is 39  Follow-Up Instructions: No follow-ups on file.   Orders:  No orders of the defined types were placed in this encounter.  No orders of the defined types were placed in this encounter.     Procedures: Large Joint Inj: L knee on 04/16/2021 11:01 AM Indications: pain and diagnostic evaluation Details: 25 G 1.5 in needle, anterolateral approach  Arthrogram: No  Medications: 40 mg methylPREDNISolone acetate 40 MG/ML; 2 mL lidocaine 1 % Outcome: tolerated well, no immediate complications Procedure, treatment alternatives, risks and benefits explained, specific risks discussed. Consent was given by the patient.      Clinical Data: No additional findings.   Subjective: Chief Complaint  Patient presents with   Left Knee - Pain  Patient presents today for left knee pain. She said that it has been hurting for 3 weeks since lifting furniture. She said that it hurts all throughout, and worse with weightbearing. She is taking Tylenol as needed.  She cannot take anti-inflammatories.    Review of Systems  All other systems reviewed and are  negative.   Objective: Vital Signs: There were no vitals taken for this visit.  Physical Exam Constitutional:      Appearance: Normal appearance.  Eyes:     Extraocular Movements: Extraocular movements intact.  Skin:    General: Skin is warm and dry.  Neurological:     General: No focal deficit present.     Mental Status: She is alert.  Psychiatric:        Mood and Affect: Mood normal.        Behavior: Behavior normal.    Ortho Exam Examination of his left knee.  She has no effusion.  No erythema no cellulitis clinically has valgus alignment.  Tender over the medial and lateral joint lines.  She does have some crepitus with range of motion good varus valgus stability Specialty Comments:  No specialty comments available.  Imaging: No results found.   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   GERD (gastroesophageal reflux disease)    no current meds.   IBS (irritable bowel syndrome)    Nipple discharge in female 08/2012   right   PONV (postoperative nausea and vomiting)    Vertigo    occasional    Family History  Problem Relation Age of Onset   Diabetes Mother    Diabetes Sister    Hypertension Sister    Hypertension Brother    Hypertension Brother    Cancer Maternal Grandmother    Anesthesia problems Son         wakes up very hyper   Breast cancer Cousin     Past Surgical History:  Procedure Laterality Date   ABDOMINAL HYSTERECTOMY  age 89   complete   BREAST DUCTAL SYSTEM EXCISION Right 09/28/2012   Procedure: RIGHT BREAST CENTRAL DUCTAL EXCISION  ;  Surgeon: Joyice Faster. Cornett, MD;  Location: [facility];  Service: General;  Laterality: Right;   BREAST EXCISIONAL BIOPSY Right    CARPAL TUNNEL RELEASE Bilateral 2004   CHOLECYSTECTOMY     ESOPHAGOGASTRODUODENOSCOPY (EGD) WITH ESOPHAGEAL DILATION  04/04/2003; 09/13/2001   TUBAL LIGATION     Social History   Occupational History   Not on file  Tobacco Use   Smoking status: Never   Smokeless tobacco: Never  Vaping Use   Vaping Use: Never used  Substance and Sexual Activity   Alcohol use: No   Drug use: No   Sexual activity: Yes    Birth control/protection: Surgical

## 2021-05-07 ENCOUNTER — Ambulatory Visit: Payer: Self-pay

## 2021-05-08 ENCOUNTER — Other Ambulatory Visit: Payer: Self-pay

## 2021-05-08 ENCOUNTER — Ambulatory Visit: Payer: No Typology Code available for payment source | Admitting: Orthopaedic Surgery

## 2021-05-28 ENCOUNTER — Ambulatory Visit: Payer: No Typology Code available for payment source | Admitting: Orthopaedic Surgery

## 2021-05-28 ENCOUNTER — Other Ambulatory Visit: Payer: Self-pay

## 2021-06-09 ENCOUNTER — Ambulatory Visit
Admission: RE | Admit: 2021-06-09 | Discharge: 2021-06-09 | Disposition: A | Discharge status: Home / Self Care | Payer: No Typology Code available for payment source | Source: Ambulatory Visit | Attending: Family Medicine | Admitting: Family Medicine

## 2021-06-09 ENCOUNTER — Other Ambulatory Visit: Payer: Self-pay

## 2021-06-09 DIAGNOSIS — Z1231 Encounter for screening mammogram for malignant neoplasm of breast: Secondary | ICD-10-CM

## 2021-06-11 ENCOUNTER — Encounter: Payer: Self-pay | Admitting: Orthopaedic Surgery

## 2021-06-11 ENCOUNTER — Ambulatory Visit (INDEPENDENT_AMBULATORY_CARE_PROVIDER_SITE_OTHER): Payer: No Typology Code available for payment source

## 2021-06-11 ENCOUNTER — Ambulatory Visit (INDEPENDENT_AMBULATORY_CARE_PROVIDER_SITE_OTHER): Payer: No Typology Code available for payment source | Admitting: Orthopaedic Surgery

## 2021-06-11 ENCOUNTER — Other Ambulatory Visit: Payer: Self-pay

## 2021-06-11 DIAGNOSIS — G8929 Other chronic pain: Secondary | ICD-10-CM

## 2021-06-11 DIAGNOSIS — M25561 Pain in right knee: Secondary | ICD-10-CM

## 2021-06-11 NOTE — Progress Notes (Signed)
Office Visit Note   Patient: Christie Manning           Date of Birth: Jan 03, 1961           MRN: [id number] Visit Date: 06/11/2021              Requested by: Sharilyn Sites, [address] [place],  [place] [address] PCP: Sharilyn Sites, MD   Assessment & Plan: Visit Diagnoses:  1. Chronic pain of right knee     Plan: Patient with a history of left knee pain presents today with popping of her right knee.  She does ride an exercise bike and she notices it when she is riding the bike.  Also notices it when she goes up and down stairs.  X-rays and exam were consistent with arthritis of her right knee with bone-on-bone findings of the patellofemoral joint I discussed the natural history of this she is not particularly painful today she just wanted to make sure there was nothing broken in her knee.  I do think she would benefit from a couple visits with physical therapy for close chain quadricep strengthening.  We have given her a prescription for this.  She will do this in the beginning of the year.  She understands if she has increasing pain she may follow-up and we can try an injection  Follow-Up Instructions: No follow-ups on file.   Orders:  Orders Placed This Encounter  Procedures   XR KNEE 3 VIEW RIGHT   No orders of the defined types were placed in this encounter.     Procedures: No procedures performed   Clinical Data: No additional findings.   Subjective: No chief complaint on file. Patient presents today for right knee pain. She said that her knee started to pop when walking about 3 weeks ago. She said that she takes Tylenol.    Review of Systems  All other systems reviewed and are negative.   Objective: Vital Signs: There were no vitals taken for this visit.  Physical Exam Patient is alert pleasant to exam Ortho Exam Examination of her right knee she has no cellulitis no redness no effusion.  She does have grinding with range of motion  which reproduces what she thinks is popping.  Nontender over the lateral medial joint line.  She has only mild tenderness along the patella border decreased patellar mobility Specialty Comments:  No specialty comments available.  Imaging: No results found.   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   GERD (gastroesophageal reflux disease)    no current meds.   IBS (irritable bowel syndrome)    Nipple discharge in female 08/2012   right   PONV (postoperative nausea and vomiting)    Vertigo    occasional    Family  History  Problem Relation Age of Onset   Diabetes Mother    Diabetes Sister    Hypertension Sister    Hypertension Brother    Hypertension Brother    Cancer Maternal Grandmother    Anesthesia problems Son        wakes up very hyper   Breast cancer Cousin     Past Surgical History:  Procedure Laterality Date   ABDOMINAL HYSTERECTOMY  age 27   complete   BREAST DUCTAL SYSTEM EXCISION Right 09/28/2012   Procedure: RIGHT BREAST CENTRAL DUCTAL EXCISION  ;  Surgeon: Joyice Faster. Cornett, MD;  Location: [facility];  Service: General;  Laterality: Right;   BREAST EXCISIONAL BIOPSY Right    CARPAL TUNNEL RELEASE Bilateral 2004   CHOLECYSTECTOMY     ESOPHAGOGASTRODUODENOSCOPY (EGD) WITH ESOPHAGEAL DILATION   04/04/2003; 09/13/2001   TUBAL LIGATION     Social History   Occupational History   Not on file  Tobacco Use   Smoking status: Never   Smokeless tobacco: Never  Vaping Use   Vaping Use: Never used  Substance and Sexual Activity   Alcohol use: No   Drug use: No   Sexual activity: Yes    Birth control/protection: Surgical

## 2021-06-13 ENCOUNTER — Telehealth: Payer: Self-pay | Admitting: Radiology

## 2021-06-13 NOTE — Telephone Encounter (Signed)
Please see message from [place] office below and advise.  Pt is not working right now and cannot afford to go to PT. Are there some exercise that can be printed out for her to do at home?  Please call her at [phone] ([phone]) to discuss

## 2021-06-13 NOTE — Telephone Encounter (Signed)
Think we have some LE exercises from the [employer] somewhere in the office-maybe in Dr Phoebe Sharps work space-please check and mail to pt-thanks-might have some in [place] but not sure

## 2021-06-16 NOTE — Telephone Encounter (Signed)
Exercises put in mail, patient aware.

## 2021-07-04 ENCOUNTER — Other Ambulatory Visit (HOSPITAL_COMMUNITY): Payer: Self-pay | Admitting: Gastroenterology

## 2021-07-04 ENCOUNTER — Other Ambulatory Visit: Payer: Self-pay | Admitting: Gastroenterology

## 2021-07-04 DIAGNOSIS — R1013 Epigastric pain: Secondary | ICD-10-CM

## 2021-07-04 DIAGNOSIS — K219 Gastro-esophageal reflux disease without esophagitis: Secondary | ICD-10-CM

## 2021-07-15 ENCOUNTER — Ambulatory Visit (HOSPITAL_COMMUNITY): Payer: PRIVATE HEALTH INSURANCE | Attending: Gastroenterology

## 2021-07-15 ENCOUNTER — Encounter (HOSPITAL_COMMUNITY): Payer: Self-pay

## 2021-07-29 ENCOUNTER — Encounter (HOSPITAL_COMMUNITY): Payer: Self-pay

## 2021-12-13 ENCOUNTER — Emergency Department (HOSPITAL_COMMUNITY)
Admission: EM | Admit: 2021-12-13 | Discharge: 2021-12-13 | Disposition: A | Discharge status: Home / Self Care | Payer: PRIVATE HEALTH INSURANCE | Attending: Emergency Medicine | Admitting: Emergency Medicine

## 2021-12-13 ENCOUNTER — Emergency Department (HOSPITAL_COMMUNITY): Payer: PRIVATE HEALTH INSURANCE

## 2021-12-13 ENCOUNTER — Other Ambulatory Visit: Payer: Self-pay

## 2021-12-13 ENCOUNTER — Encounter (HOSPITAL_COMMUNITY): Payer: Self-pay

## 2021-12-13 DIAGNOSIS — J309 Allergic rhinitis, unspecified: Secondary | ICD-10-CM | POA: Insufficient documentation

## 2021-12-13 DIAGNOSIS — R000 Tachycardia, unspecified: Secondary | ICD-10-CM | POA: Diagnosis not present

## 2021-12-13 DIAGNOSIS — K219 Gastro-esophageal reflux disease without esophagitis: Secondary | ICD-10-CM | POA: Diagnosis not present

## 2021-12-13 DIAGNOSIS — R42 Dizziness and giddiness: Secondary | ICD-10-CM | POA: Insufficient documentation

## 2021-12-13 DIAGNOSIS — R1010 Upper abdominal pain, unspecified: Secondary | ICD-10-CM | POA: Diagnosis present

## 2021-12-13 LAB — CBC WITH DIFFERENTIAL/PLATELET
Abs Immature Granulocytes: 0.01 10*3/uL (ref 0.00–0.07)
Basophils Absolute: 0.1 10*3/uL (ref 0.0–0.1)
Basophils Relative: 1 %
Eosinophils Absolute: 0.1 10*3/uL (ref 0.0–0.5)
Eosinophils Relative: 2 %
HCT: 40.6 % (ref 36.0–46.0)
Hemoglobin: 13.3 g/dL (ref 12.0–15.0)
Immature Granulocytes: 0 %
Lymphocytes Relative: 42 %
Lymphs Abs: 2.2 10*3/uL (ref 0.7–4.0)
MCH: 31 pg (ref 26.0–34.0)
MCHC: 32.8 g/dL (ref 30.0–36.0)
MCV: 94.6 fL (ref 80.0–100.0)
Monocytes Absolute: 0.3 10*3/uL (ref 0.1–1.0)
Monocytes Relative: 7 %
Neutro Abs: 2.4 10*3/uL (ref 1.7–7.7)
Neutrophils Relative %: 48 %
Platelets: 214 10*3/uL (ref 150–400)
RBC: 4.29 MIL/uL (ref 3.87–5.11)
RDW: 12.5 % (ref 11.5–15.5)
WBC: 5.1 10*3/uL (ref 4.0–10.5)
nRBC: 0 % (ref 0.0–0.2)

## 2021-12-13 LAB — COMPREHENSIVE METABOLIC PANEL
ALT: 16 U/L (ref 0–44)
AST: 20 U/L (ref 15–41)
Albumin: 4 g/dL (ref 3.5–5.0)
Alkaline Phosphatase: 76 U/L (ref 38–126)
Anion gap: 11 (ref 5–15)
BUN: 11 mg/dL (ref 8–23)
CO2: 26 mmol/L (ref 22–32)
Calcium: 9.6 mg/dL (ref 8.9–10.3)
Chloride: 103 mmol/L (ref 98–111)
Creatinine, Ser: 0.69 mg/dL (ref 0.44–1.00)
GFR, Estimated: >60 mL/min (ref >60)
Glucose, Bld: 105 mg/dL — ABNORMAL HIGH (ref 70–99)
Potassium: 3.6 mmol/L (ref 3.5–5.1)
Sodium: 140 mmol/L (ref 135–145)
Total Bilirubin: 0.9 mg/dL (ref 0.3–1.2)
Total Protein: 7.1 g/dL (ref 6.5–8.1)

## 2021-12-13 LAB — LIPASE, BLOOD: Lipase: 32 U/L (ref 11–51)

## 2021-12-13 LAB — TROPONIN I (HIGH SENSITIVITY)
Troponin I (High Sensitivity): 3 ng/L (ref <18)
Troponin I (High Sensitivity): 4 ng/L (ref <18)

## 2021-12-13 MED ORDER — MAALOX MAX 400-400-40 MG/5ML PO SUSP: 15.0000 mL | Freq: Four times a day (QID) | ORAL | 0 refills | Status: AC | PRN

## 2021-12-13 MED ORDER — PANTOPRAZOLE SODIUM 40 MG IV SOLR
40.0000 mg | Freq: Once | INTRAVENOUS | Status: AC
Start: 2021-12-13 — End: 2021-12-13
  Administered 2021-12-13: 40 mg via INTRAVENOUS
  Filled 2021-12-13: qty 10

## 2021-12-13 MED ORDER — LACTATED RINGERS IV BOLUS
1000.0000 mL | Freq: Once | INTRAVENOUS | Status: AC
  Administered 2021-12-13: 1000 mL via INTRAVENOUS

## 2021-12-13 MED ORDER — PANTOPRAZOLE SODIUM 20 MG PO TBEC: 20.0000 mg | DELAYED_RELEASE_TABLET | Freq: Every day | ORAL | 0 refills | Status: AC

## 2021-12-13 MED ORDER — ALUM & MAG HYDROXIDE-SIMETH 200-200-20 MG/5ML PO SUSP
30.0000 mL | Freq: Once | ORAL | Status: AC
  Administered 2021-12-13: 30 mL via ORAL
  Filled 2021-12-13: qty 30

## 2021-12-13 MED ORDER — OXYMETAZOLINE HCL 0.05 % NA SOLN
1.0000 | Freq: Once | NASAL | Status: AC
  Administered 2021-12-13: 1 via NASAL
  Filled 2021-12-13: qty 30

## 2021-12-13 NOTE — ED Notes (Signed)
Got patient intop a gown on the monitor patient is resting with call bell in reach

## 2021-12-13 NOTE — Discharge Instructions (Addendum)
Your lab work was normal today. You likely have too much acid in your stomach causing your symptoms. You also may have a sinus infection and/or allergies. We will prescribe you medications to help lower your acid in your stomach. Additionally, you may use the Afrin nose spray once daily for a maximum of 3 days. DO NOT USE MORE THAN 3 DAYS. Thank you for letting us care for you.

## 2021-12-13 NOTE — ED Provider Notes (Signed)
[facility] EMERGENCY DEPARTMENT Provider Note   CSN: [id number] Arrival date & time: 12/13/21  1322     History  No chief complaint on file.   Christie Manning is a 61 y.o. female.  61 year old female who is 1 month s/p EGD and colonoscopy with Dr. Wilford Corner presents with multiple complaints today.  Patient states that she has had several years of intermittent episodes of upper abdominal burning with associated lightheadedness and dizziness.  She states that the pain worsens with certain foods.  She states that she underwent EGD and colonoscopy due to these episodes and was found to have x2 nondysplastic polyps which were removed.  She states that since this procedure, she has continued to have burning and episodes of dizziness which attributes to the discomfort.  She states that she does not take any medications on a daily basis as needed aside from intermittent Pepcid.  She currently endorses congestion in her right ear with associated nasal congestion, runny nose, sore throat, and a frontal headache that has been ongoing for the past week.  She also has not been taking any medications for this.  She states that she presented to the emergency department today as she had an episode of a "twinge" under her right axilla and felt like someone was "touching my right arm".  She denies true chest pain or associated arm numbness.  The history is provided by the patient.       Home Medications Prior to Admission medications   Medication Sig Start Date End Date Taking? Authorizing Provider  alum & mag hydroxide-simeth (MAALOX MAX) 400-400-40 MG/5ML suspension Take 15 mLs by mouth every 6 (six) hours as needed for indigestion. 12/13/21  Yes Nalin Mazzocco, [place], MD  pantoprazole (PROTONIX) 20 MG tablet Take 1 tablet (20 mg total) by mouth daily. 12/13/21 01/12/22 Yes Gerturde Kuba, [place], MD  Coenzyme Q10 (CO Q 10 PO) Take 1 capsule by mouth daily.    [provider]   dexlansoprazole (DEXILANT) 60 MG capsule Take 60 mg by mouth daily.    [provider]  famotidine (PEPCID) 20 MG tablet Take 1 tablet (20 mg total) by mouth 2 (two) times daily. 0/92/33   Delora Fuel, MD  ibuprofen (ADVIL,MOTRIN) 800 MG tablet TAKE 1 TAB AS NEEDED FOR PAIN 05/16/18   Garald Balding, MD  metoprolol tartrate (LOPRESSOR) 25 MG tablet Take 0.5 tablets (12.5 mg total) by mouth 2 (two) times daily. 12/07/16   Isla Pence, MD  ondansetron (ZOFRAN) 4 MG tablet Take 1 tablet (4 mg total) by mouth every 8 (eight) hours as needed for nausea or vomiting. 09/11/18   Mesner, Corene Cornea, MD  oseltamivir (TAMIFLU) 75 MG capsule Take 1 capsule (75 mg total) by mouth every 12 (twelve) hours. 09/11/18   Mesner, Corene Cornea, MD  sucralfate (CARAFATE) 1 g tablet Take 1 tablet (1 g total) by mouth 3 (three) times daily as needed. 11/24/16   Julianne Rice, MD      Allergies    Azithromycin, Cefdinir, Aspirin, Doxycycline, Sulfa antibiotics, and Amoxicillin    Review of Systems   Review of Systems  Constitutional:  Negative for chills and fever.  HENT:  Positive for congestion, postnasal drip, rhinorrhea and sinus pressure.   Respiratory:  Negative for cough and shortness of breath.   Cardiovascular:  Negative for chest pain.  Gastrointestinal:  Positive for abdominal pain, constipation and nausea. Negative for blood in stool, diarrhea and vomiting.  Genitourinary:  Negative for dysuria and hematuria.  Neurological:  Positive for dizziness and light-headedness.    Physical Exam Updated Vital Signs BP 133/62   Pulse 99   Temp 97.8 F (36.6 C) (Oral)   Resp 14   SpO2 100%  Physical Exam Vitals and nursing note reviewed.  Constitutional:      General: She is not in acute distress.    Appearance: Normal appearance. She is well-developed. She is not ill-appearing.  HENT:     Head: Normocephalic.     Right Ear: Tympanic membrane normal.     Left Ear: Tympanic membrane normal.      Nose:     Right Turbinates: Enlarged and swollen.     Left Turbinates: Enlarged and swollen.     Right Sinus: Maxillary sinus tenderness present.     Left Sinus: Maxillary sinus tenderness present.     Comments: Bilateral turbinate hyperemia and congestion    Mouth/Throat:     Pharynx: Oropharynx is clear.     Comments: Postnasal drip present to the posterior oropharynx Cardiovascular:     Rate and Rhythm: Regular rhythm. Tachycardia present.     Pulses:          Radial pulses are 2+ on the right side and 2+ on the left side.     Heart sounds: No murmur heard. Pulmonary:     Effort: Pulmonary effort is normal. No respiratory distress.     Breath sounds: Normal breath sounds.  Abdominal:     Palpations: Abdomen is soft.     Tenderness: There is no abdominal tenderness. There is no guarding.  Skin:    General: Skin is warm and dry.  Neurological:     Mental Status: She is alert.     ED Results / Procedures / Treatments   Labs (all labs ordered are listed, but only abnormal results are displayed) Labs Reviewed  COMPREHENSIVE METABOLIC PANEL - Abnormal; Notable for the following components:      Result Value   Glucose, Bld 105 (*)    All other components within normal limits  CBC WITH DIFFERENTIAL/PLATELET  LIPASE, BLOOD  TROPONIN I (HIGH SENSITIVITY)  TROPONIN I (HIGH SENSITIVITY)    EKG EKG Interpretation  Date/Time:  Saturday December 13 2021 13:37:37 EDT Ventricular Rate:  104 PR Interval:  178 QRS Duration: 76 QT Interval:  344 QTC Calculation: 452 R Axis:   -1 Text Interpretation: Sinus tachycardia Cannot rule out Anterior infarct , age undetermined Abnormal ECG When compared with ECG of 11-Sep-2018 01:59, PREVIOUS ECG IS PRESENT No significant change was found Confirmed by Ezequiel Essex [phone]) on 12/13/2021 2:47:57 PM  Radiology DG Chest Port 1 View  Result Date: 12/13/2021 CLINICAL DATA:  Chest pain and syncope. EXAM: PORTABLE CHEST 1 VIEW COMPARISON:  None  Available. FINDINGS: The cardiomediastinal silhouette is unremarkable. There is no evidence of focal airspace disease, pulmonary edema, suspicious pulmonary nodule/mass, pleural effusion, or pneumothorax. The tip of the LEFT costophrenic angle is off the field of view. No acute bony abnormalities are identified. IMPRESSION: No active disease. Electronically Signed   By: Margarette Canada M.D.   On: 12/13/2021 16:09    Procedures Procedures    Medications Ordered in ED Medications  alum & mag hydroxide-simeth (MAALOX/MYLANTA) 200-200-20 MG/5ML suspension 30 mL (30 mLs Oral Given 12/13/21 1538)  pantoprazole (PROTONIX) injection 40 mg (40 mg Intravenous Given 12/13/21 1545)  lactated ringers bolus 1,000 mL (0 mLs Intravenous Stopped 12/13/21 1658)  oxymetazoline (AFRIN) 0.05 % nasal spray 1 spray (1 spray Each  Nare Given 12/13/21 1539)    ED Course/ Medical Decision Making/ A&P                           Medical Decision Making Amount and/or Complexity of Data Reviewed Labs: ordered. Radiology: ordered.  Risk OTC drugs. Prescription drug management.   61 year old female with a history as above presents today with multiple complaints including abdominal burning, dizziness, and URI symptoms.  On arrival, she is mildly tachycardic but normotensive and afebrile in the ED.  Her abdomen is soft and nontender on my exam.  Description of her abdominal symptoms are most consistent with underlying reflux disease.  Additionally considered gastritis, peptic ulcer disease, duodenitis, pancreatitis.  No right upper quadrant tenderness on exam that would support a biliary pathology including cholelithiasis or cholecystitis.  We additionally considered an atypical ACS picture given her associated dizziness.  EKG was obtained which I reviewed, interpreted as above.  Troponins are stable and negative x2 thus lower suspicion for ACS.  She denies pleuritic component or shortness of breath that would support PE.  I  reviewed and interpreted the remainder of her labs which are largely reassuring.  She has no leukocytosis or leukopenia that would support a systemic infectious or inflammatory process.  She is not anemic.  CMP without significant electrolyte abnormalities, no evidence of renal or liver dysfunction.  Chest x-ray without focal airspace consolidations that would support pneumonia, no evidence of pulmonary edema or pneumomediastinum.  Regarding her URI symptoms, she does have nasal turbinate hyperemia and congestion which is likely contributing to her symptoms. It is possible that she continues to have an ongoing sinus infection (likely viral) vs allergic rhinitis.  She was treated with Afrin in the ED with improvement.  On reevaluation following GI cocktail and Afrin, patient reported significant resolution of her symptoms.  It is unlikely that her symptoms today are related to her prior EGD. These results were discussed with the patient and her husband at bedside who both verbalized understanding.  Shared decision-making with the patient who was comfortable with discharge home with very close outpatient follow-up.  Will plan for course of maalox and protonix as an outpatient. Strict ED return precautions given including worsening of her symptoms, inability tolerate p.o., development of fevers, or chest pain.  At this time, patient stable for discharge home.  Final Clinical Impression(s) / ED Diagnoses Final diagnoses:  Gastroesophageal reflux disease, unspecified whether esophagitis present  Allergic rhinitis, unspecified seasonality, unspecified trigger    Rx / DC Orders ED Discharge Orders          Ordered    pantoprazole (PROTONIX) 20 MG tablet  Daily        12/13/21 1926    alum & mag hydroxide-simeth (MAALOX MAX) 800-349-17 MG/5ML suspension  Every 6 hours PRN        12/13/21 1926              Tiffanni Scarfo, [place], MD 12/13/21 2250    Ezequiel Essex, MD 12/13/21 2352

## 2021-12-13 NOTE — ED Triage Notes (Signed)
Patient complains of several weeks of abdominal burning and right arm discomfort for 3-4 weeks following endoscopy. States she has arm tightness and not feeling right with near multiple syncopal events since procedure

## 2022-03-20 ENCOUNTER — Other Ambulatory Visit: Payer: Self-pay | Admitting: Obstetrics and Gynecology

## 2022-03-20 DIAGNOSIS — E28319 Asymptomatic premature menopause: Secondary | ICD-10-CM

## 2022-06-01 ENCOUNTER — Other Ambulatory Visit: Payer: Self-pay | Admitting: Family Medicine

## 2022-06-01 DIAGNOSIS — Z1231 Encounter for screening mammogram for malignant neoplasm of breast: Secondary | ICD-10-CM

## 2022-06-12 ENCOUNTER — Ambulatory Visit: Payer: PRIVATE HEALTH INSURANCE

## 2022-07-15 ENCOUNTER — Ambulatory Visit
Admission: RE | Admit: 2022-07-15 | Discharge: 2022-07-15 | Disposition: A | Discharge status: Home / Self Care | Payer: Self-pay | Source: Ambulatory Visit | Attending: Family Medicine | Admitting: Family Medicine

## 2022-07-15 DIAGNOSIS — Z1231 Encounter for screening mammogram for malignant neoplasm of breast: Secondary | ICD-10-CM

## 2022-08-06 ENCOUNTER — Ambulatory Visit: Payer: PRIVATE HEALTH INSURANCE

## 2022-10-18 IMAGING — DX DG CHEST 1V PORT
1 series · 1 of 1 positions shown · non-contrast
Comparison: None Available.

CLINICAL DATA: Chest pain and syncope.

EXAM:
PORTABLE CHEST 1 VIEW

[chest ap]
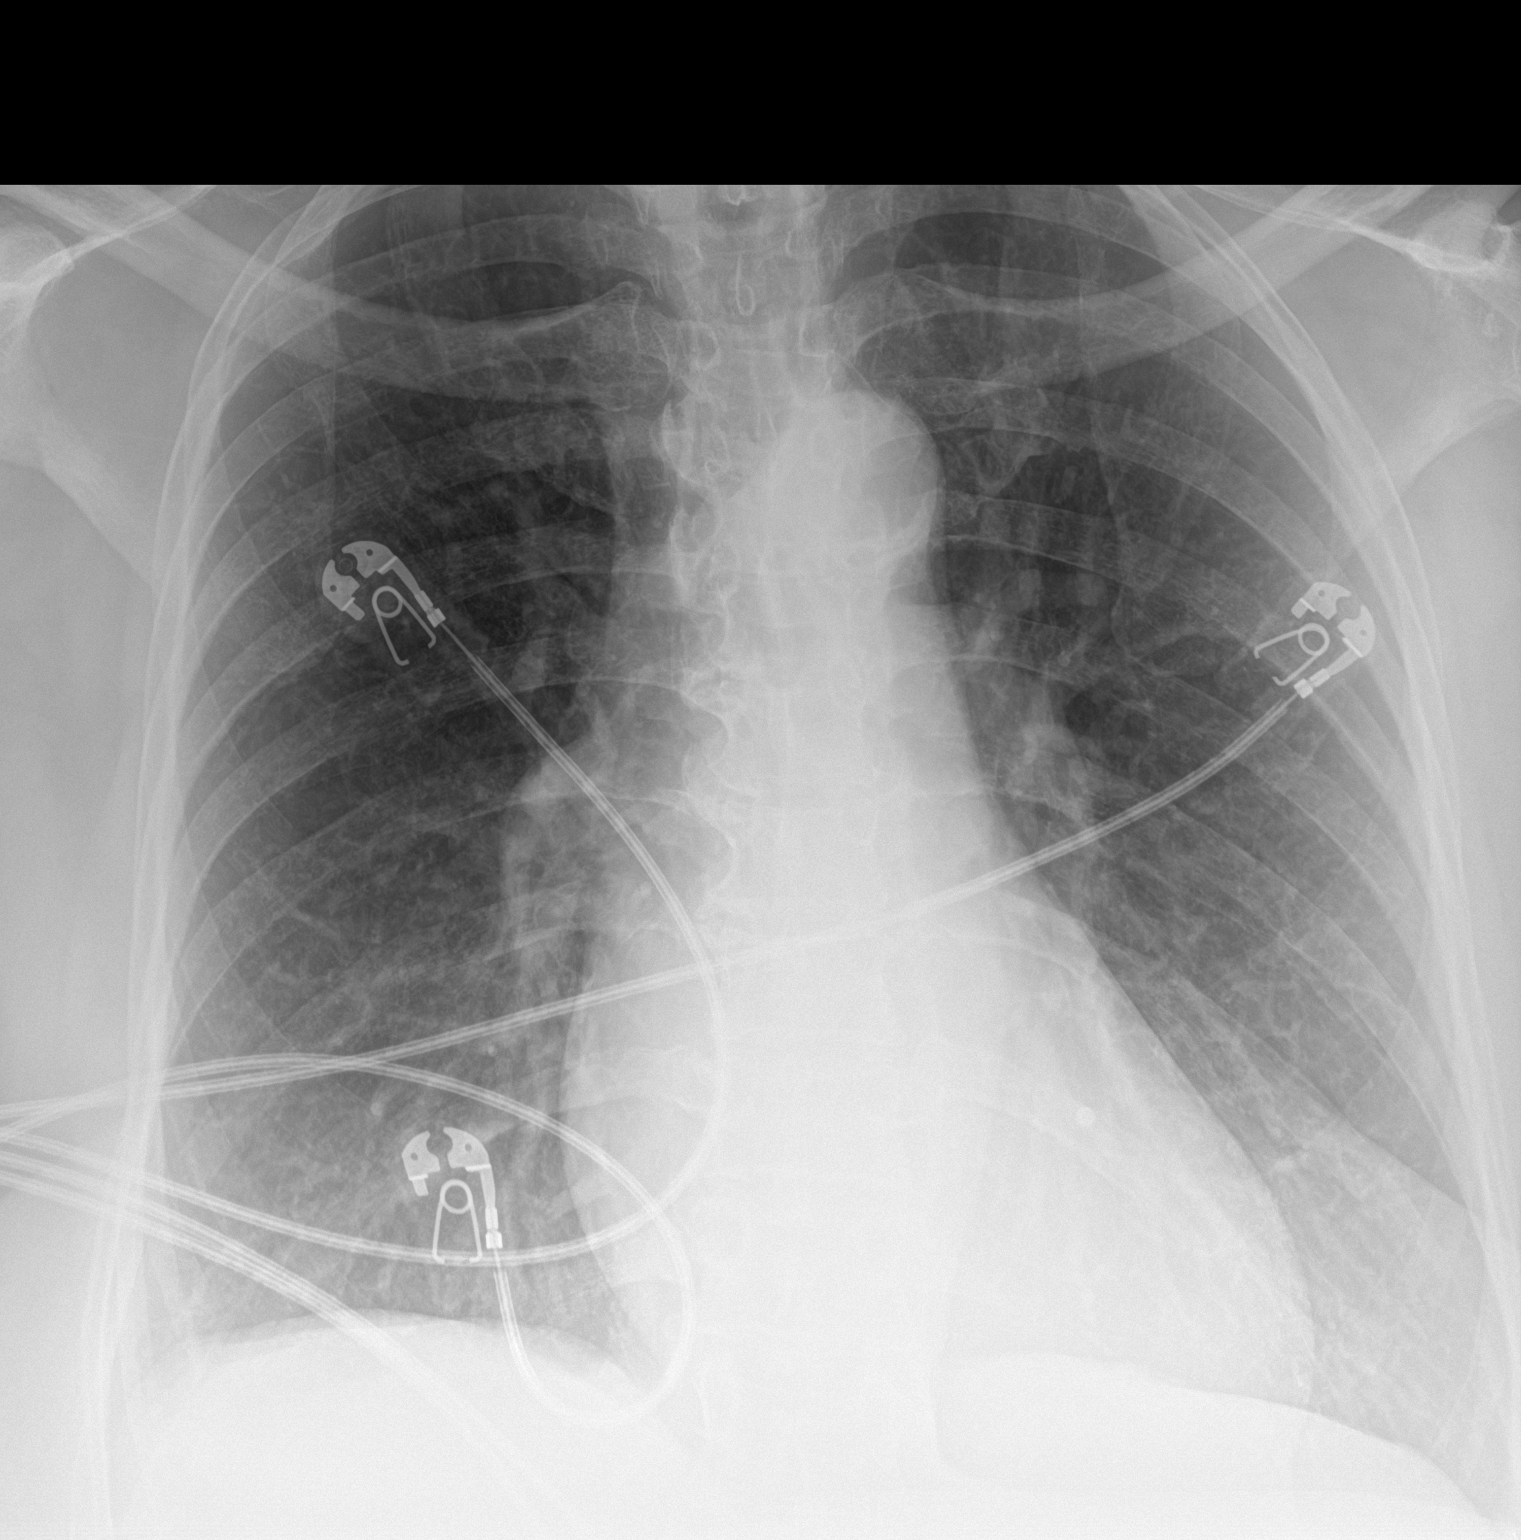

[1 of 1 positions shown; findings below may reference images not displayed]

FINDINGS: The cardiomediastinal silhouette is unremarkable.

There is no evidence of focal airspace disease, pulmonary edema,
suspicious pulmonary nodule/mass, pleural effusion, or pneumothorax.

The tip of the LEFT costophrenic angle is off the field of view.

No acute bony abnormalities are identified.
IMPRESSION: No active disease.

## 2023-06-09 ENCOUNTER — Other Ambulatory Visit: Payer: Self-pay | Admitting: Family Medicine

## 2023-06-09 DIAGNOSIS — Z0000 Encounter for general adult medical examination without abnormal findings: Secondary | ICD-10-CM

## 2023-07-20 ENCOUNTER — Ambulatory Visit: Payer: Self-pay

## 2023-07-23 ENCOUNTER — Ambulatory Visit: Payer: Self-pay

## 2023-08-11 ENCOUNTER — Ambulatory Visit: Payer: Self-pay

## 2023-08-31 ENCOUNTER — Ambulatory Visit
Admission: RE | Admit: 2023-08-31 | Discharge: 2023-08-31 | Disposition: A | Discharge status: Home / Self Care | Payer: Self-pay | Source: Ambulatory Visit | Attending: Family Medicine | Admitting: Family Medicine

## 2023-08-31 DIAGNOSIS — Z0000 Encounter for general adult medical examination without abnormal findings: Secondary | ICD-10-CM
